# Patient Record
Sex: Male | Born: 1950 | Race: White | Hispanic: No | Marital: Married | Smoking: Former smoker
Health system: Southern US, Community
[De-identification: ages and names within clinical notes are randomized; demographics above are authoritative.]

## PROBLEM LIST (undated history)

## (undated) DIAGNOSIS — I10 Essential (primary) hypertension: Secondary | ICD-10-CM

## (undated) DIAGNOSIS — M549 Dorsalgia, unspecified: Secondary | ICD-10-CM

## (undated) DIAGNOSIS — G8929 Other chronic pain: Secondary | ICD-10-CM

## (undated) DIAGNOSIS — I959 Hypotension, unspecified: Secondary | ICD-10-CM

## (undated) DIAGNOSIS — E785 Hyperlipidemia, unspecified: Secondary | ICD-10-CM

## (undated) DIAGNOSIS — IMO0002 Reserved for concepts with insufficient information to code with codable children: Secondary | ICD-10-CM

## (undated) HISTORY — PX: ORIF FOOT FRACTURE: SHX2123

## (undated) HISTORY — DX: Hyperlipidemia, unspecified: E78.5

## (undated) HISTORY — DX: Hypotension, unspecified: I95.9

## (undated) HISTORY — DX: Essential (primary) hypertension: I10

---

## 2003-10-25 ENCOUNTER — Inpatient Hospital Stay (HOSPITAL_COMMUNITY): Admission: EM | Admit: 2003-10-25 | Discharge: 2003-10-26 | Payer: Self-pay

## 2003-10-27 ENCOUNTER — Emergency Department (HOSPITAL_COMMUNITY): Admission: EM | Admit: 2003-10-27 | Discharge: 2003-10-27 | Payer: Self-pay | Admitting: Emergency Medicine

## 2005-07-09 ENCOUNTER — Emergency Department (HOSPITAL_COMMUNITY): Admission: EM | Admit: 2005-07-09 | Discharge: 2005-07-09 | Payer: Self-pay | Admitting: Emergency Medicine

## 2009-06-05 ENCOUNTER — Emergency Department (HOSPITAL_COMMUNITY): Admission: EM | Admit: 2009-06-05 | Discharge: 2009-06-05 | Payer: Self-pay | Admitting: Emergency Medicine

## 2009-06-19 ENCOUNTER — Ambulatory Visit (HOSPITAL_BASED_OUTPATIENT_CLINIC_OR_DEPARTMENT_OTHER): Admission: RE | Admit: 2009-06-19 | Discharge: 2009-06-19 | Payer: Self-pay | Admitting: Orthopedic Surgery

## 2010-07-14 ENCOUNTER — Encounter: Admission: RE | Admit: 2010-07-14 | Discharge: 2010-07-14 | Payer: Self-pay | Admitting: Family Medicine

## 2010-08-24 IMAGING — CR DG CERVICAL SPINE COMPLETE 4+V
7 series · 7 of 7 positions shown · non-contrast
Comparison: None available.

CLINICAL DATA: Fall.

CERVICAL SPINE - COMPLETE 4+ VIEW

[t c-spine a.p.]
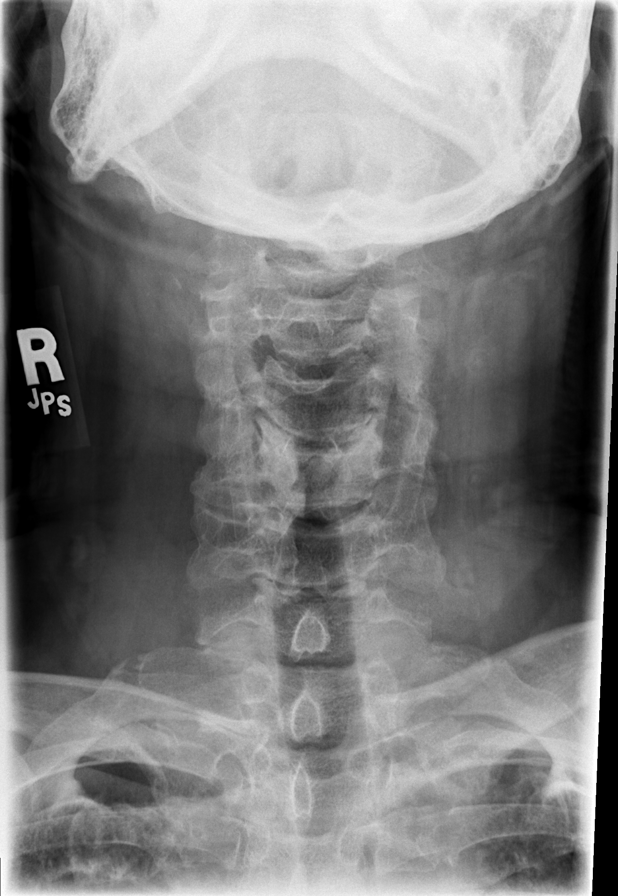

[t c-spine odontoid]
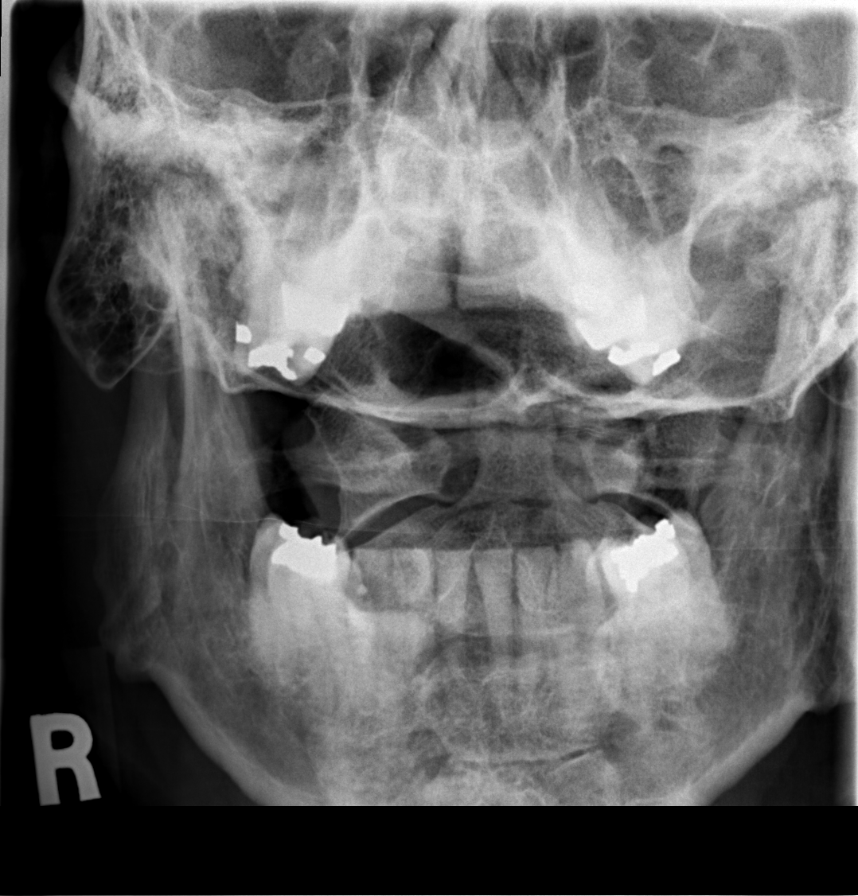

[t c-spine oblique (1 of 2)]
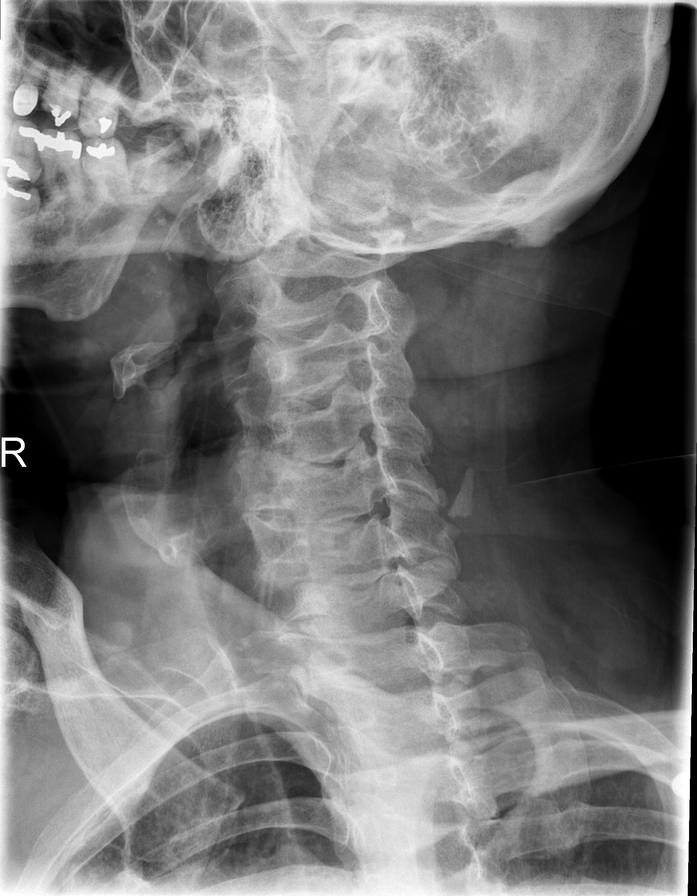

[t c-spine oblique (2 of 2)]
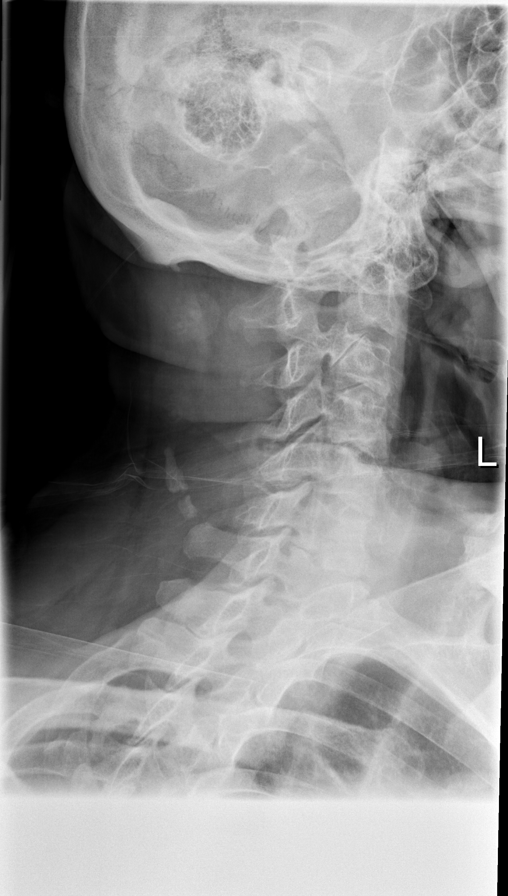

[t swimmers]
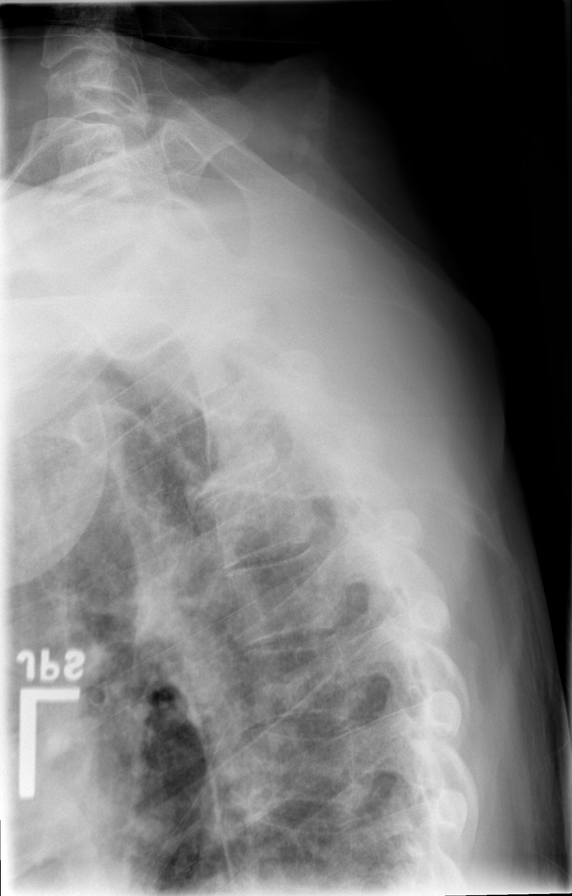

[t swimmers *]
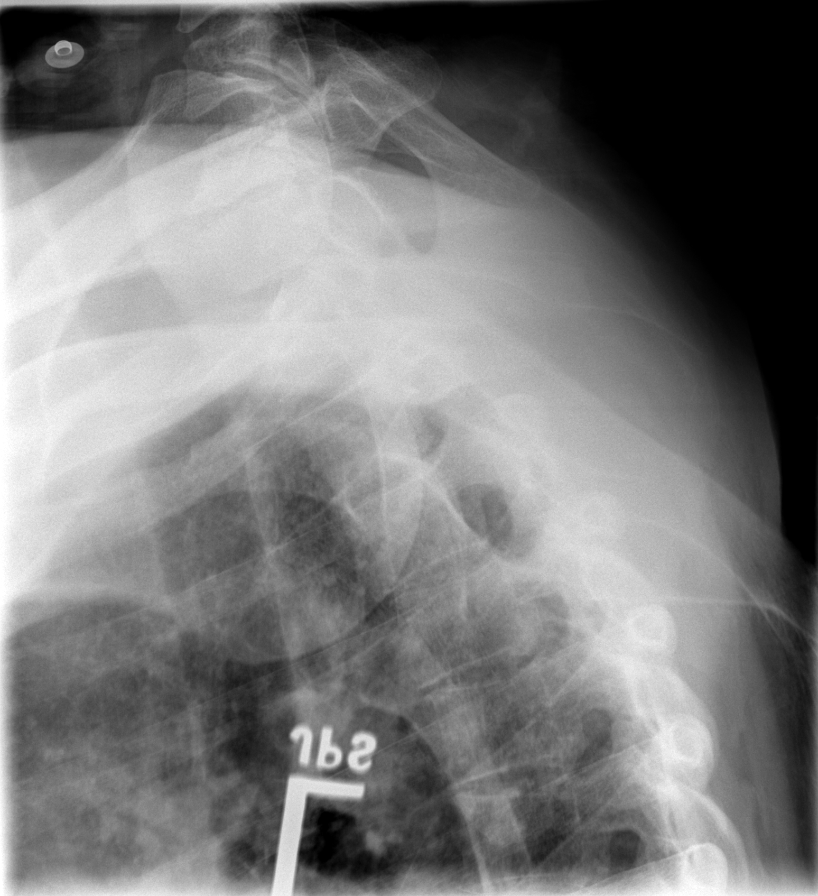

[w c-spine lat *]
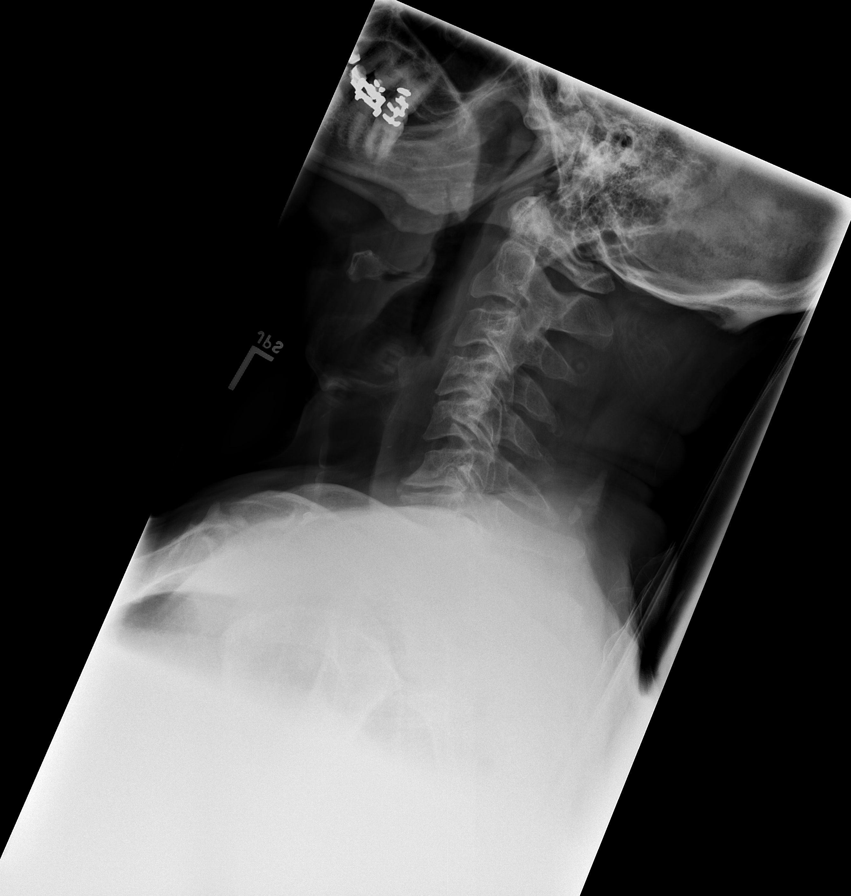

[7 of 7 positions shown; findings below may reference images not displayed]

FINDINGS: Vertebral body height and alignment are maintained.
There is degenerative disc disease most notable at C4-5 and C6-7.
Prevertebral soft tissues appear normal.
IMPRESSION: 1.  No acute finding.
2.  Degenerative disc disease.

## 2011-02-01 NOTE — Discharge Summary (Signed)
Antonio Elliott, Antonio Elliott                          ACCOUNT NO.:  [id number]   MEDICAL RECORD NO.:  [id number]                   PATIENT TYPE:  INP   LOCATION:  2021                                 FACILITY:  [facility]   PHYSICIAN:  Ellie Lunch, M.D.                   DATE OF BIRTH:  11-08-1950   DATE OF ADMISSION:  10/25/2003  DATE OF DISCHARGE:  10/26/2003                                 DISCHARGE SUMMARY   DISCHARGE DIAGNOSIS:  Chest pain, most likely vasovagal.   DISCHARGE MEDICATIONS:  1. Aspirin 81 mg every day.  2. Sublingual nitroglycerin 0.4 mg every 3 to 5 minutes p.r.n.   DISPOSITION:  The patient is to follow up with Dr. Windle Guard as per his  convenience. He is scheduled for a stress test   Dictation ends here                                                Ellie Lunch, M.D.    PB/MEDQ  D:  10/28/2003  T:  10/28/2003  Job:  [id number]   cc:   Windle Guard, M.D.  [address]  [place], [place] [address]  Fax: [phone]

## 2011-02-01 NOTE — Discharge Summary (Signed)
Antonio Elliott, Antonio Elliott                          ACCOUNT NO.:  [id number]   MEDICAL RECORD NO.:  [id number]                   PATIENT TYPE:  INP   LOCATION:  2021                                 FACILITY:  [facility]   PHYSICIAN:  Madaline Guthrie, M.D.                 DATE OF BIRTH:  January 06, 1951   DATE OF ADMISSION:  10/25/2003  DATE OF DISCHARGE:  10/26/2003                                 DISCHARGE SUMMARY   DISCHARGE DIAGNOSES:  Chest pain, most likely vasovagal, although ischemia  cannot be ruled out until stress test.   DISCHARGE MEDICATIONS:  1. Aspirin 81 mg daily.  2. Sublingual nitroglycerin 0.4 mg q3-5 minutes p.r.n.   DISPOSITION AND FOLLOW UP:  The patient is to follow up with Dr. Jeannetta Nap as  per convenience.  He is scheduled for a stress test with [facility] on October 28, 2003 at 10:15 a.m.   ADMISSION HISTORY AND PHYSICAL:  A 60 year old white male with no  significant past medical history who had an episode of heaviness in his  chest accompanied by flushing and dizziness while driving to work.  The  episode lasted for less than 1 minute.  The patient got his blood pressure  checked as soon as he got to work, which was within 78 minutes of the  episode, and it was 180/113.  His usual blood pressure runs around  130's/80's.  There was no chest pain, nausea, vomiting, sweating, or  radiation.  No similar episodes in the past.  The patient gives a history of  taking amoxicillin 500 mg t.i.d. for a sinus infection for the past 5 days.   PHYSICAL EXAMINATION:  GENERAL APPEARANCE:  No abnormality detected.  CARDIOVASCULAR:  S1 and S2 normal.  Regular rate and rhythm.  RESPIRATORY:  Bilateral breath sounds heard.  GI:  Abdomen soft, nontender, nondistended.  Bowel sounds present.  EXTREMITIES:  No clubbing, cyanosis, or edema.   ADMISSION LABORATORIES:  Sodium 140, potassium 4.1, chloride 109, bicarb 25,  BUN 13, creatinine 1, glucose 106, hemoglobin 16.  PT 11.9, INR  0.8, D-dimer  0.32.  Cardiac enzymes negative.  CT of the head without contrast.  No  bleed.  Chest x-ray normal.   HOSPITAL COURSE:  Chest pain.  The patient had negative cardiac enzymes.  No  acute changes on EKG, and the chest x-ray was normal.  Because of the past  history of smoking, and, in regards to the patient's age, he is advised to  get a stress test, which has been scheduled for October 27, 2003 at 10:15  a.m.  Also, a fasting lipid panel was obtained, and the results are as  follows - cholesterol 235, triglycerides 82, HDL cholesterol 64, LDL  cholesterol 155.   DISCHARGE LABORATORIES:  Hemoglobin 15.3, sodium 142, potassium 3.9, glucose  98.  Cardiac enzymes x8 negative.  AST 32, ALT 33, alkaline phosphatase 47,  total bilirubin 1, calcium 9.2.  Lipid profile as mentioned above.      Madaline Guthrie, M.D.                       Madaline Guthrie, M.D.    [place]/MEDQ  D:  10/28/2003  T:  10/29/2003  Job:  [id number]   cc:   Windle Guard, M.D.  [address]  [place], [place] [address]  Fax: [phone]

## 2011-08-04 ENCOUNTER — Encounter: Payer: Self-pay | Admitting: *Deleted

## 2011-08-04 ENCOUNTER — Emergency Department (HOSPITAL_COMMUNITY)
Admission: EM | Admit: 2011-08-04 | Discharge: 2011-08-04 | Disposition: A | Discharge status: Home / Self Care | Payer: BC Managed Care – PPO | Source: Home / Self Care | Attending: Family Medicine | Admitting: Family Medicine

## 2011-08-04 DIAGNOSIS — K047 Periapical abscess without sinus: Secondary | ICD-10-CM

## 2011-08-04 MED ORDER — ACETAMINOPHEN-CODEINE #3 300-30 MG PO TABS: 1.0000 | ORAL_TABLET | Freq: Four times a day (QID) | ORAL | Status: AC | PRN

## 2011-08-04 MED ORDER — AMOXICILLIN 500 MG PO CAPS: 500.0000 mg | ORAL_CAPSULE | Freq: Three times a day (TID) | ORAL | Status: AC

## 2011-08-04 NOTE — ED Provider Notes (Signed)
History     CSN: [id number] Arrival date & time: 08/04/2011 12:17 PM   First MD Initiated Contact with Patient 08/04/11 1059      Chief Complaint  Patient presents with  . Dental Pain    dental pain x 24 hours     (Consider location/radiation/quality/duration/timing/severity/associated sxs/prior treatment) Patient is a 60 y.o. male presenting with tooth pain.  Dental PainThe primary symptoms include mouth pain. The symptoms began 3 to 5 days ago. The symptoms are worsening.  Additional symptoms include: gum swelling and gum tenderness.    History reviewed. No pertinent past medical history.  Past Surgical History  Procedure Date  . Orif foot fracture     History reviewed. No pertinent family history.  History  Substance Use Topics  . Smoking status: Never Smoker   . Smokeless tobacco: Not on file  . Alcohol Use: Yes      Review of Systems  Constitutional: Negative.   HENT: Negative.   Respiratory: Negative.   Cardiovascular: Negative.   Gastrointestinal: Negative.   Genitourinary: Negative.     Allergies  Review of patient's allergies indicates no known allergies.  Home Medications   Current Outpatient Rx  Name Route Sig Dispense Refill  . ACETAMINOPHEN-CODEINE #3 300-30 MG PO TABS Oral Take 1-2 tablets by mouth every 6 (six) hours as needed for pain. 20 tablet 0  . AMOXICILLIN 500 MG PO CAPS Oral Take 1 capsule (500 mg total) by mouth 3 (three) times daily. 30 capsule 0    BP 154/96  Pulse 75  Temp(Src) 98.1 F (36.7 C) (Oral)  Resp 16  SpO2 98%  Physical Exam  Nursing note and vitals reviewed. Constitutional: He appears well-developed and well-nourished. No distress.  HENT:       Teeth in moderate repair. Right lower molar with caries and dental fillings is tooth causing pain. Slight swelling and erythema of gums.   Neck: Normal range of motion. Neck supple.  Cardiovascular: Normal rate.   Pulmonary/Chest: Effort normal.  Lymphadenopathy:    He has no cervical adenopathy.  Skin: Skin is warm and dry.    ED Course  Procedures (including critical care time)  Labs Reviewed - No data to display No results found.   1. Dental abscess       MDM          Randa Spike, MD 08/04/11 [phone]

## 2012-02-04 ENCOUNTER — Encounter (INDEPENDENT_AMBULATORY_CARE_PROVIDER_SITE_OTHER): Payer: BC Managed Care – PPO | Admitting: Ophthalmology

## 2012-02-04 DIAGNOSIS — H2510 Age-related nuclear cataract, unspecified eye: Secondary | ICD-10-CM

## 2012-02-04 DIAGNOSIS — H33309 Unspecified retinal break, unspecified eye: Secondary | ICD-10-CM

## 2012-02-04 DIAGNOSIS — H43819 Vitreous degeneration, unspecified eye: Secondary | ICD-10-CM

## 2012-02-24 ENCOUNTER — Ambulatory Visit (INDEPENDENT_AMBULATORY_CARE_PROVIDER_SITE_OTHER): Payer: BC Managed Care – PPO | Admitting: Ophthalmology

## 2012-02-24 DIAGNOSIS — H33309 Unspecified retinal break, unspecified eye: Secondary | ICD-10-CM

## 2012-03-06 ENCOUNTER — Ambulatory Visit (INDEPENDENT_AMBULATORY_CARE_PROVIDER_SITE_OTHER): Payer: BC Managed Care – PPO | Admitting: Ophthalmology

## 2012-03-06 DIAGNOSIS — H33309 Unspecified retinal break, unspecified eye: Secondary | ICD-10-CM

## 2012-05-04 ENCOUNTER — Encounter (INDEPENDENT_AMBULATORY_CARE_PROVIDER_SITE_OTHER): Payer: BC Managed Care – PPO | Admitting: Ophthalmology

## 2012-05-04 DIAGNOSIS — H33309 Unspecified retinal break, unspecified eye: Secondary | ICD-10-CM

## 2012-05-04 DIAGNOSIS — H43819 Vitreous degeneration, unspecified eye: Secondary | ICD-10-CM

## 2012-07-06 ENCOUNTER — Ambulatory Visit (INDEPENDENT_AMBULATORY_CARE_PROVIDER_SITE_OTHER): Payer: BC Managed Care – PPO | Admitting: Ophthalmology

## 2012-07-06 DIAGNOSIS — H2510 Age-related nuclear cataract, unspecified eye: Secondary | ICD-10-CM

## 2012-07-06 DIAGNOSIS — H33309 Unspecified retinal break, unspecified eye: Secondary | ICD-10-CM

## 2012-07-06 DIAGNOSIS — H43819 Vitreous degeneration, unspecified eye: Secondary | ICD-10-CM

## 2013-05-20 ENCOUNTER — Other Ambulatory Visit: Payer: Self-pay | Admitting: Family Medicine

## 2013-05-20 DIAGNOSIS — M545 Low back pain: Secondary | ICD-10-CM

## 2013-05-25 ENCOUNTER — Ambulatory Visit
Admission: RE | Admit: 2013-05-25 | Discharge: 2013-05-25 | Disposition: A | Discharge status: Home / Self Care | Payer: BC Managed Care – PPO | Source: Ambulatory Visit | Attending: Family Medicine | Admitting: Family Medicine

## 2013-05-25 DIAGNOSIS — M545 Low back pain, unspecified: Secondary | ICD-10-CM

## 2013-07-06 ENCOUNTER — Ambulatory Visit (INDEPENDENT_AMBULATORY_CARE_PROVIDER_SITE_OTHER): Payer: BC Managed Care – PPO | Admitting: Ophthalmology

## 2013-11-20 ENCOUNTER — Emergency Department (HOSPITAL_COMMUNITY): Payer: BC Managed Care – PPO

## 2013-11-20 ENCOUNTER — Encounter (HOSPITAL_COMMUNITY): Payer: Self-pay | Admitting: Emergency Medicine

## 2013-11-20 ENCOUNTER — Emergency Department (HOSPITAL_COMMUNITY)
Admission: EM | Admit: 2013-11-20 | Discharge: 2013-11-20 | Disposition: A | Discharge status: Home / Self Care | Payer: BC Managed Care – PPO | Attending: Emergency Medicine | Admitting: Emergency Medicine

## 2013-11-20 DIAGNOSIS — N451 Epididymitis: Secondary | ICD-10-CM

## 2013-11-20 DIAGNOSIS — Z8739 Personal history of other diseases of the musculoskeletal system and connective tissue: Secondary | ICD-10-CM | POA: Insufficient documentation

## 2013-11-20 DIAGNOSIS — G8929 Other chronic pain: Secondary | ICD-10-CM | POA: Insufficient documentation

## 2013-11-20 DIAGNOSIS — F17200 Nicotine dependence, unspecified, uncomplicated: Secondary | ICD-10-CM | POA: Insufficient documentation

## 2013-11-20 DIAGNOSIS — N453 Epididymo-orchitis: Secondary | ICD-10-CM | POA: Insufficient documentation

## 2013-11-20 HISTORY — DX: Dorsalgia, unspecified: M54.9

## 2013-11-20 HISTORY — DX: Other chronic pain: G89.29

## 2013-11-20 HISTORY — DX: Reserved for concepts with insufficient information to code with codable children: IMO0002

## 2013-11-20 LAB — BASIC METABOLIC PANEL
BUN: 10 mg/dL (ref 6–23)
CHLORIDE: 106 meq/L (ref 96–112)
CO2: 23 meq/L (ref 19–32)
Calcium: 9.2 mg/dL (ref 8.4–10.5)
Creatinine, Ser: 0.94 mg/dL (ref 0.50–1.35)
GFR calc non Af Amer: 88 mL/min — ABNORMAL LOW (ref >90)
GLUCOSE: 115 mg/dL — AB (ref 70–99)
POTASSIUM: 4.1 meq/L (ref 3.7–5.3)
Sodium: 141 mEq/L (ref 137–147)

## 2013-11-20 LAB — CBC WITH DIFFERENTIAL/PLATELET
BASOS PCT: 0 % (ref 0–1)
Basophils Absolute: 0 10*3/uL (ref 0.0–0.1)
Eosinophils Absolute: 0.1 10*3/uL (ref 0.0–0.7)
Eosinophils Relative: 1 % (ref 0–5)
HEMATOCRIT: 47.2 % (ref 39.0–52.0)
HEMOGLOBIN: 16.6 g/dL (ref 13.0–17.0)
LYMPHS PCT: 16 % (ref 12–46)
Lymphs Abs: 2 10*3/uL (ref 0.7–4.0)
MCH: 34.4 pg — ABNORMAL HIGH (ref 26.0–34.0)
MCHC: 35.2 g/dL (ref 30.0–36.0)
MCV: 97.7 fL (ref 78.0–100.0)
MONO ABS: 0.9 10*3/uL (ref 0.1–1.0)
MONOS PCT: 7 % (ref 3–12)
NEUTROS ABS: 9 10*3/uL — AB (ref 1.7–7.7)
Neutrophils Relative %: 75 % (ref 43–77)
Platelets: 224 10*3/uL (ref 150–400)
RBC: 4.83 MIL/uL (ref 4.22–5.81)
RDW: 13 % (ref 11.5–15.5)
WBC: 12 10*3/uL — ABNORMAL HIGH (ref 4.0–10.5)

## 2013-11-20 LAB — URINALYSIS, ROUTINE W REFLEX MICROSCOPIC
BILIRUBIN URINE: NEGATIVE
Glucose, UA: NEGATIVE mg/dL
HGB URINE DIPSTICK: NEGATIVE
Ketones, ur: NEGATIVE mg/dL
Leukocytes, UA: NEGATIVE
Nitrite: NEGATIVE
PH: 6 (ref 5.0–8.0)
Protein, ur: NEGATIVE mg/dL
SPECIFIC GRAVITY, URINE: 1.018 (ref 1.005–1.030)
Urobilinogen, UA: 0.2 mg/dL (ref 0.0–1.0)

## 2013-11-20 MED ORDER — LEVOFLOXACIN 500 MG PO TABS: 500.0000 mg | ORAL_TABLET | Freq: Every day | ORAL | Status: DC

## 2013-11-20 MED ORDER — IBUPROFEN 800 MG PO TABS: 800.0000 mg | ORAL_TABLET | Freq: Three times a day (TID) | ORAL | Status: DC

## 2013-11-20 MED ORDER — HYDROCODONE-ACETAMINOPHEN 5-325 MG PO TABS: 1.0000 | ORAL_TABLET | ORAL | Status: DC | PRN

## 2013-11-20 NOTE — ED Notes (Signed)
Blood and urine obtained and sent, [facility], orders received and initiated. Pt denies need for pain med at this time. Alert, NAD, calm, interactive, resps e/u, speaking in clear complete sentences, VSS. Steady gait. Pending US.

## 2013-11-20 NOTE — Discharge Instructions (Signed)
Epididymitis  Epididymitis is a swelling (inflammation) of the epididymis. The epididymis is a cord-like structure along the back part of the testicle. Epididymitis is usually, but not always, caused by infection. This is usually a sudden problem beginning with chills, fever and pain behind the scrotum and in the testicle. There may be swelling and redness of the testicle.  DIAGNOSIS   Physical examination will reveal a tender, swollen epididymis. Sometimes, cultures are obtained from the urine or from prostate secretions to help find out if there is an infection or if the cause is a different problem. Sometimes, blood work is performed to see if your white blood cell count is elevated and if a germ (bacterial) or viral infection is present. Using this knowledge, an appropriate medicine which kills germs (antibiotic) can be chosen by your caregiver. A viral infection causing epididymitis will most often go away (resolve) without treatment.  HOME CARE INSTRUCTIONS   · Hot sitz baths for 20 minutes, 4 times per day, may help relieve pain.  · Only take over-the-counter or prescription medicines for pain, discomfort or fever as directed by your caregiver.  · Take all medicines, including antibiotics, as directed. Take the antibiotics for the full prescribed length of time even if you are feeling better.  · It is very important to keep all follow-up appointments.  SEEK IMMEDIATE MEDICAL CARE IF:   · You have a fever.  · You have pain not relieved with medicines.  · You have any worsening of your problems.  · Your pain seems to come and go.  · You develop pain, redness, and swelling in the scrotum and surrounding areas.  MAKE SURE YOU:   · Understand these instructions.  · Will watch your condition.  · Will get help right away if you are not doing well or get worse.  Document Released: 08/30/2000 Document Revised: 11/25/2011 Document Reviewed: 07/20/2009  Exit[employer]® Patient Information ©2014 [employer].

## 2013-11-20 NOTE — ED Notes (Signed)
C/o LLQ & L testicle pain, also L testicle swelling. Swelling onset 1 week ago, pain onset yesterday. Also reports chronic degenerative disc back pain, (denies: nvd, dizziness, other urinary sx), (denies: hematuria, d/c, urinary hx, urinary surgery, change in flow), no GU MD, last ate 1000 yesterday, no meds PTA.

## 2013-11-20 NOTE — ED Provider Notes (Signed)
CSN: [id number]     Arrival date & time 11/20/13  0540 History   None    Chief Complaint  Patient presents with  . Abdominal Pain  . Groin Swelling     (Consider location/radiation/quality/duration/timing/severity/associated sxs/prior Treatment) HPI 63 year old male presents to emergency room with complaint of left lower quadrant pain and left scrotal tenderness and swelling.  Patient reports onset of pain yesterday afternoon.  Pain came on gradual.  He reports that he has noticed some swelling in his left testicle./Scrotum.  Over the last week.  He denies any fever, chills, no nausea and vomiting.  He had normal bowel yesterday.  He has had colonoscopy in the last 10 years.  That was normal.  No prior history of similar symptoms.  He denies any swelling or bulge in his groin.  No urinary symptoms Past Medical History  Diagnosis Date  . Degenerative disk disease   . Back pain, chronic    Past Surgical History  Procedure Laterality Date  . Orif foot fracture     No family history on file. History  Substance Use Topics  . Smoking status: Current Some Day Smoker  . Smokeless tobacco: Not on file  . Alcohol Use: Yes    Review of Systems  See History of Present Illness; otherwise all other systems are reviewed and negative   Allergies  Review of patient's allergies indicates no known allergies.  Home Medications  No current outpatient prescriptions on file. BP 150/89  Pulse 91  Temp(Src) 98.2 F (36.8 C) (Oral)  Resp 18  SpO2 99% Physical Exam  Nursing note and vitals reviewed. Constitutional: He is oriented to person, place, and time. He appears well-developed and well-nourished.  HENT:  Head: Normocephalic and atraumatic.  Nose: Nose normal.  Mouth/Throat: Oropharynx is clear and moist.  Eyes: Conjunctivae and EOM are normal. Pupils are equal, round, and reactive to light.  Neck: Normal range of motion. Neck supple. No JVD present. No tracheal deviation present. No  thyromegaly present.  Cardiovascular: Normal rate, regular rhythm, normal heart sounds and intact distal pulses.  Exam reveals no gallop and no friction rub.   No murmur heard. Pulmonary/Chest: Effort normal and breath sounds normal. No stridor. No respiratory distress. He has no wheezes. He has no rales. He exhibits no tenderness.  Abdominal: Soft. Bowel sounds are normal. He exhibits no distension and no mass. There is no tenderness. There is no rebound and no guarding.  Genitourinary:  A shunt, with tenderness with palpation of left testicle.  No significant edema or swelling noted.  He has normal lie.  Normal cremasteric response.  Tenderness is mainly posterior and lateral of the testicle.  There is no hernias noted on exam.  No scrotal masses.  No inguinal masses.  Patient without abdominal pain on palpation.  Musculoskeletal: Normal range of motion. He exhibits no edema and no tenderness.  Lymphadenopathy:    He has no cervical adenopathy.  Neurological: He is alert and oriented to person, place, and time. He exhibits normal muscle tone. Coordination normal.  Skin: Skin is warm and dry. No rash noted. No erythema. No pallor.  Psychiatric: He has a normal mood and affect. His behavior is normal. Judgment and thought content normal.    ED Course  Procedures (including critical care time) Labs Review Labs Reviewed - No data to display Imaging Review No results found.   EKG Interpretation None      MDM   Final diagnoses:  None  63 year old male with left lower cord or pain, left testicular pain.  Differential includes kidney stone, testicular torsion, epididymitis, diverticulitis.  We'll plan for labs, urine, Doppler, and ultrasound of testicle, and scrotum.  If negative, may need CT scan of abdomen, pelvis.    Olivia Mackielga M Lynell Greenhouse, MD 11/20/13 [phone]

## 2013-11-20 NOTE — ED Notes (Signed)
Alert, NAD, calm, interactive, to US. 

## 2019-12-23 ENCOUNTER — Ambulatory Visit: Payer: Medicare Other | Attending: Internal Medicine

## 2019-12-23 DIAGNOSIS — Z23 Encounter for immunization: Secondary | ICD-10-CM

## 2019-12-23 NOTE — Progress Notes (Signed)
   Covid-19 Vaccination Clinic  Name:  Antonio Elliott    MRN: [id number] DOB: 1950-10-28  12/23/2019  Antonio Elliott was observed post Covid-19 immunization for 15 minutes without incident. He was provided with Vaccine Information Sheet and instruction to access the V-Safe system.   Antonio Elliott was instructed to call 911 with any severe reactions post vaccine: Marland Kitchen Difficulty breathing  . Swelling of face and throat  . A fast heartbeat  . A bad rash all over body  . Dizziness and weakness   Immunizations Administered    Name Date Dose VIS Date Route   Pfizer COVID-19 Vaccine 12/23/2019  1:01 PM 0.3 mL 08/27/2019 Intramuscular   Manufacturer: [employer], [employer]   Lot: [id number]   NDC: [address]-5

## 2020-01-17 ENCOUNTER — Ambulatory Visit: Payer: Medicare Other | Attending: Internal Medicine

## 2020-01-17 DIAGNOSIS — Z23 Encounter for immunization: Secondary | ICD-10-CM

## 2020-01-17 NOTE — Progress Notes (Signed)
   Covid-19 Vaccination Clinic  Name:  Antonio Elliott    MRN: [id number] DOB: 10/23/1950  01/17/2020  Antonio Elliott was observed post Covid-19 immunization for 15 minutes without incident. He was provided with Vaccine Information Sheet and instruction to access the V-Safe system.   Antonio Elliott was instructed to call 911 with any severe reactions post vaccine: Marland Kitchen Difficulty breathing  . Swelling of face and throat  . A fast heartbeat  . A bad rash all over body  . Dizziness and weakness   Immunizations Administered    Name Date Dose VIS Date Route   Pfizer COVID-19 Vaccine 01/17/2020 12:44 PM 0.3 mL 11/10/2018 Intramuscular   Manufacturer: [employer], [employer]   Lot: [id number]   NDC: [address]-5

## 2020-11-13 ENCOUNTER — Ambulatory Visit: Payer: Self-pay | Admitting: Cardiology

## 2020-11-13 ENCOUNTER — Other Ambulatory Visit: Payer: Self-pay

## 2020-11-13 ENCOUNTER — Encounter: Payer: Self-pay | Admitting: Cardiology

## 2020-11-13 VITALS — BP 155/101 | HR 77 | Temp 97.7°F | Resp 16 | Ht >= 80 in | Wt 209.2 lb

## 2020-11-13 DIAGNOSIS — I491 Atrial premature depolarization: Secondary | ICD-10-CM

## 2020-11-13 DIAGNOSIS — R030 Elevated blood-pressure reading, without diagnosis of hypertension: Secondary | ICD-10-CM

## 2020-11-13 DIAGNOSIS — R55 Syncope and collapse: Secondary | ICD-10-CM

## 2020-11-13 NOTE — Progress Notes (Signed)
Primary Physician/Referring:  Leonard Downing, MD  Patient ID: Antonio Elliott, male    DOB: 1951-02-22, 70 y.o.   MRN: [id number]  Chief Complaint  Patient presents with  . Hypotension  . Dizziness  . New Patient (Initial Visit)   HPI:    Antonio Elliott  is a 70 y.o. Caucasian male with no significant prior cardiovascular history except for degenerative disc disease, over the past 2 weeks has noticed sudden onset of dizzy spells and near syncope.  First episode occurred while he was working in his yard, on 10/26/2020, suddenly felt he was going to pass out.  Episode lasted for about a minute or so then he felt extremely fatigued and weak.  A week later that his on 11/03/2020, while he was driving to [place], suddenly felt he is going to pass out.  He had to stop his vehicle and his wife took over driving.  Again he felt extremely fatigued and weak after the episode, episode lasted a few minutes.  Otherwise he is feeling well, he goes for a walk at least 1.5 miles on a daily basis with his wife which he did this morning as well without any dyspnea or chest pain.  He has now been told to have hypertension or hyperlipidemia.  He noticed his blood pressure was low 1 day following his near syncopal episode a total 90/70 mmHg but overall states that blood pressure is usually around 120/70 mmHg.  Past Medical History:  Diagnosis Date  . Back pain, chronic   . Degenerative disk disease   . Hypotension    Past Surgical History:  Procedure Laterality Date  . ORIF FOOT FRACTURE     Family History  Problem Relation Age of Onset  . Alzheimer's disease Mother     Social History   Tobacco Use  . Smoking status: Former Smoker    Types: Cigarettes    Quit date: 2020    Years since quitting: 2.1  . Smokeless tobacco: Never Used  Substance Use Topics  . Alcohol use: Yes    Alcohol/week: 1.0 standard drink    Types: 1 Glasses of wine per week    Comment: occasionally    Marital  Status: Married  ROS  Review of Systems  Cardiovascular: Positive for palpitations and syncope (near syncope). Negative for chest pain, dyspnea on exertion and leg swelling.  Musculoskeletal: Positive for back pain.  Gastrointestinal: Negative for melena.  Neurological: Positive for dizziness.   Objective  Blood pressure (!) 155/101, pulse 77, temperature 97.7 F (36.5 C), temperature source Temporal, resp. rate 16, height 8' (2.438 m), weight 209 lb 3.2 oz (94.9 kg), SpO2 96 %.  Vitals with BMI 11/13/2020 11/20/2013 11/20/2013  Height $Remov'8\' 0"'XUqzup$  - -  Weight 209 lbs 3 oz - -  BMI 76.73 - -  Systolic 419 379 024  Diastolic 097 88 83  Pulse 77 73 74   Orthostatic VS for the past 72 hrs (Last 3 readings):  Orthostatic BP Patient Position BP Location Cuff Size Orthostatic Pulse  11/13/20 1409 (!) 154/101 Standing Left Arm Large 83  11/13/20 1408 (!) 149/105 Sitting Left Arm Large 88  11/13/20 1407 (!) 157/105 Supine Left Arm Large 78      Physical Exam Cardiovascular:     Rate and Rhythm: Normal rate and regular rhythm.     Pulses: Intact distal pulses.     Heart sounds: Normal heart sounds. No murmur heard. No gallop.  Comments: No leg edema, no JVD. Pulmonary:     Effort: Pulmonary effort is normal.     Breath sounds: Normal breath sounds.  Abdominal:     General: Bowel sounds are normal.     Palpations: Abdomen is soft.    Laboratory examination:   No results for input(s): NA, K, CL, CO2, GLUCOSE, BUN, CREATININE, CALCIUM, GFRNONAA, GFRAA in the last 8760 hours. CrCl cannot be calculated (Patient's most recent lab result is older than the maximum 21 days allowed.).  CMP Latest Ref Rng & Units 11/20/2013  Glucose 70 - 99 mg/dL 115(H)  BUN 6 - 23 mg/dL 10  Creatinine 0.50 - 1.35 mg/dL 0.94  Sodium 137 - 147 mEq/L 141  Potassium 3.7 - 5.3 mEq/L 4.1  Chloride 96 - 112 mEq/L 106  CO2 19 - 32 mEq/L 23  Calcium 8.4 - 10.5 mg/dL 9.2   CBC Latest Ref Rng & Units 11/20/2013  WBC 4.0  - 10.5 K/uL 12.0(H)  Hemoglobin 13.0 - 17.0 g/dL 16.6  Hematocrit 39.0 - 52.0 % 47.2  Platelets 150 - 400 K/uL 224    Lipid Panel No results for input(s): CHOL, TRIG, LDLCALC, VLDL, HDL, CHOLHDL, LDLDIRECT in the last 8760 hours.  HEMOGLOBIN A1C No results found for: HGBA1C, MPG TSH No results for input(s): TSH in the last 8760 hours.  External labs:    Hemoglobin 17.500 G/ 05/15/2020 Platelets 176.000 X1 05/15/2020  Creatinine, Serum 0.910 MG/ 05/15/2020 ALT (SGPT) 22.000 IU/ 05/15/2020  Medications and allergies  No Known Allergies   Outpatient Medications Prior to Visit  Medication Sig Dispense Refill  . Ascorbic Acid (VITAMIN C) 1000 MG tablet Take 1,000 mg by mouth daily.    . ASCORBIC ACID PO Take 2,000 mg by mouth daily.    Marland Kitchen aspirin EC 325 MG tablet Take 650 mg by mouth daily as needed for moderate pain.    . Omega-3 Fatty Acids ([employer] FISH OIL) 1000 MG CAPS Take 1 capsule by mouth daily.    Marland Kitchen HYDROcodone-acetaminophen (NORCO/VICODIN) 5-325 MG per tablet Take 1-2 tablets by mouth every 4 (four) hours as needed. 12 tablet 0  . ibuprofen (ADVIL,MOTRIN) 200 MG tablet Take 600 mg by mouth every 6 (six) hours as needed (foot or back pain).    Marland Kitchen ibuprofen (ADVIL,MOTRIN) 800 MG tablet Take 1 tablet (800 mg total) by mouth 3 (three) times daily. 21 tablet 0  . levofloxacin (LEVAQUIN) 500 MG tablet Take 1 tablet (500 mg total) by mouth daily. 10 tablet 0  . ranitidine (ZANTAC) 150 MG tablet Take 150 mg by mouth daily as needed for heartburn.     No facility-administered medications prior to visit.    Radiology:   No results found.  Cardiac Studies:   None EKG:     EKG 11/13/2020: Normal sinus rhythm at rate of 72 bpm, normal axis, no evidence of ischemia, normal EKG. Frequent PACs.  Assessment     ICD-10-CM   1. Near syncope  R55 EKG 12-Lead    LONG TERM MONITOR (3-14 DAYS)    [facility] ECHOCARDIOGRAM COMPLETE    [facility] CARDIAC STRESS TEST    Lipid Panel With LDL/HDL Ratio     CMP14+EGFR    CBC  2. Elevated BP without diagnosis of hypertension  R03.0 TSH  3. PAC (premature atrial contraction)  I49.1 TSH    Medications Discontinued During This Encounter  Medication Reason  . ranitidine (ZANTAC) 150 MG tablet Error  . levofloxacin (LEVAQUIN) 500 MG tablet Error  .  ibuprofen (ADVIL,MOTRIN) 800 MG tablet Error  . ibuprofen (ADVIL,MOTRIN) 200 MG tablet Error  . HYDROcodone-acetaminophen (NORCO/VICODIN) 5-325 MG per tablet Error    No orders of the defined types were placed in this encounter.  Orders Placed This Encounter  Procedures  . Lipid Panel With LDL/HDL Ratio  . CMP14+EGFR  . CBC  . TSH  . LONG TERM MONITOR (3-14 DAYS)    Standing Status:   Future    Standing Expiration Date:   11/13/2021    Order Specific Question:   Where should this test be performed?    Answer:   [facility]    Order Specific Question:   Does the patient have an implanted cardiac device?    Answer:   No    Order Specific Question:   Prescribed days of wear    Answer:   68    Order Specific Question:   Release to patient    Answer:   Immediate  . [facility] CARDIAC STRESS TEST    Standing Status:   Future    Standing Expiration Date:   01/11/2021  . EKG 12-Lead  . [facility] ECHOCARDIOGRAM COMPLETE    Standing Status:   Future    Standing Expiration Date:   11/13/2021   Recommendations:   [place] Yepiz Etherington is a 70 y.o. [place] male with no significant prior cardiovascular history except for degenerative disc disease, over the past 2 weeks has noticed sudden onset of dizzy spells and near syncope.  First episode occurred while he was working in his yard, on 10/26/2020, suddenly felt he was going to pass out.  Episode lasted for about a minute or so then he felt extremely fatigued and weak.  A week later that his on 11/03/2020, while he was driving to [place], suddenly felt he is going to pass out.  He had to pull over and his wife drove the rest of the way.  Symptoms are very  classic for sudden onset near syncope, appears to be cardiogenic.  I will perform extended outpatient EKG monitoring for 2 weeks.  We will obtain an echocardiogram and routine treadmill exercise stress test.  His blood pressure is markedly elevated today, he was orthostatic negative.  He will continue to monitor his blood pressure at home and report if they persistently remain high.  I will have a low threshold to start him on therapy.  I will see him back in 3 to 4 weeks for follow-up.  We will obtain basic labs including CMP, CBC, TSH and lipid profile testing.    Adrian Prows, MD, [facility] 11/13/2020, 2:46 PM Office: [phone]

## 2020-11-14 ENCOUNTER — Other Ambulatory Visit: Payer: Self-pay

## 2020-11-14 ENCOUNTER — Other Ambulatory Visit: Payer: Medicare Other

## 2020-11-14 DIAGNOSIS — R55 Syncope and collapse: Secondary | ICD-10-CM

## 2020-11-15 LAB — CMP14+EGFR
ALT: 27 IU/L (ref 0–44)
AST: 18 IU/L (ref 0–40)
Albumin/Globulin Ratio: 2 (ref 1.2–2.2)
Albumin: 4.3 g/dL (ref 3.8–4.8)
Alkaline Phosphatase: 61 IU/L (ref 44–121)
BUN/Creatinine Ratio: 15 (ref 10–24)
BUN: 17 mg/dL (ref 8–27)
Bilirubin Total: 0.5 mg/dL (ref 0.0–1.2)
CO2: 21 mmol/L (ref 20–29)
Calcium: 9.8 mg/dL (ref 8.6–10.2)
Chloride: 105 mmol/L (ref 96–106)
Creatinine, Ser: 1.16 mg/dL (ref 0.76–1.27)
Globulin, Total: 2.2 g/dL (ref 1.5–4.5)
Glucose: 104 mg/dL — ABNORMAL HIGH (ref 65–99)
Potassium: 5.4 mmol/L — ABNORMAL HIGH (ref 3.5–5.2)
Sodium: 142 mmol/L (ref 134–144)
Total Protein: 6.5 g/dL (ref 6.0–8.5)
eGFR: 68 mL/min/{1.73_m2} (ref >59)

## 2020-11-15 LAB — CBC
Hematocrit: 48.7 % (ref 37.5–51.0)
Hemoglobin: 16.4 g/dL (ref 13.0–17.7)
MCH: 33.5 pg — ABNORMAL HIGH (ref 26.6–33.0)
MCHC: 33.7 g/dL (ref 31.5–35.7)
MCV: 99 fL — ABNORMAL HIGH (ref 79–97)
Platelets: 237 10*3/uL (ref 150–450)
RBC: 4.9 x10E6/uL (ref 4.14–5.80)
RDW: 12.4 % (ref 11.6–15.4)
WBC: 6.3 10*3/uL (ref 3.4–10.8)

## 2020-11-15 LAB — LIPID PANEL WITH LDL/HDL RATIO
Cholesterol, Total: 263 mg/dL — ABNORMAL HIGH (ref 100–199)
HDL: 50 mg/dL (ref >39)
LDL Chol Calc (NIH): 194 mg/dL — ABNORMAL HIGH (ref 0–99)
LDL/HDL Ratio: 3.9 ratio — ABNORMAL HIGH (ref 0.0–3.6)
Triglycerides: 107 mg/dL (ref 0–149)
VLDL Cholesterol Cal: 19 mg/dL (ref 5–40)

## 2020-11-15 LAB — TSH: TSH: 1.48 u[IU]/mL (ref 0.450–4.500)

## 2020-11-15 NOTE — Progress Notes (Signed)
Markedly elevated LDL cholesterol and will need therapy.  Serum glucose mildly elevated, potassium mildly elevated, otherwise CMP normal.  CBC reveals mild macrocytosis. TSH normal. Will discuss on office visit. CC: PCP

## 2020-11-21 ENCOUNTER — Telehealth: Payer: Self-pay

## 2020-11-21 ENCOUNTER — Ambulatory Visit: Payer: Medicare Other

## 2020-11-21 ENCOUNTER — Other Ambulatory Visit: Payer: Self-pay

## 2020-11-21 DIAGNOSIS — R55 Syncope and collapse: Secondary | ICD-10-CM

## 2020-11-21 NOTE — Telephone Encounter (Signed)
Patient came in for echo today and showed me where he is wearing the monitor and his skin is severly reacting to it, he will take it off today and mail it back, he wore it for 7 days.

## 2020-11-21 NOTE — Telephone Encounter (Signed)
Sure

## 2020-11-22 ENCOUNTER — Ambulatory Visit: Payer: Medicare Other

## 2020-11-22 DIAGNOSIS — R55 Syncope and collapse: Secondary | ICD-10-CM

## 2020-11-24 NOTE — Progress Notes (Signed)
No ischemia. Low work load and reduced exercise capacity

## 2020-11-27 NOTE — Progress Notes (Signed)
Mild AI. Otherwise normal. Discuss on OV

## 2020-12-14 ENCOUNTER — Other Ambulatory Visit: Payer: Self-pay

## 2020-12-14 ENCOUNTER — Ambulatory Visit: Payer: Medicare Other | Admitting: Cardiology

## 2020-12-14 ENCOUNTER — Encounter: Payer: Self-pay | Admitting: Cardiology

## 2020-12-14 VITALS — BP 157/94 | HR 94 | Temp 98.0°F | Resp 16 | Ht 70.0 in | Wt 210.4 lb

## 2020-12-14 DIAGNOSIS — R55 Syncope and collapse: Secondary | ICD-10-CM

## 2020-12-14 DIAGNOSIS — E7800 Pure hypercholesterolemia, unspecified: Secondary | ICD-10-CM

## 2020-12-14 DIAGNOSIS — R739 Hyperglycemia, unspecified: Secondary | ICD-10-CM

## 2020-12-14 DIAGNOSIS — I441 Atrioventricular block, second degree: Secondary | ICD-10-CM

## 2020-12-14 MED ORDER — AMLODIPINE BESYLATE 10 MG PO TABS: 10.0000 mg | ORAL_TABLET | Freq: Every evening | ORAL | 2 refills | Status: DC

## 2020-12-14 MED ORDER — ROSUVASTATIN CALCIUM 20 MG PO TABS: 20.0000 mg | ORAL_TABLET | Freq: Every day | ORAL | 2 refills | Status: DC

## 2020-12-14 NOTE — Patient Instructions (Signed)
CrCl cannot be calculated (Patient's most recent lab result is older than the maximum 21 days allowed.).  CMP Latest Ref Rng & Units 11/14/2020 11/20/2013  Glucose 65 - 99 mg/dL 798(X) 211(H)  BUN 8 - 27 mg/dL 17 10  Creatinine 4.17 - 1.27 mg/dL 4.08 1.44  Sodium 818 - 144 mmol/L 142 141  Potassium 3.5 - 5.2 mmol/L 5.4(H) 4.1  Chloride 96 - 106 mmol/L 105 106  CO2 20 - 29 mmol/L 21 23  Calcium 8.6 - 10.2 mg/dL 9.8 9.2  Total Protein 6.0 - 8.5 g/dL 6.5 -  Total Bilirubin 0.0 - 1.2 mg/dL 0.5 -  Alkaline Phos 44 - 121 IU/L 61 -  AST 0 - 40 IU/L 18 -  ALT 0 - 44 IU/L 27 -   CBC Latest Ref Rng & Units 11/14/2020 11/20/2013  WBC 3.4 - 10.8 x10E3/uL 6.3 12.0(H)  Hemoglobin 13.0 - 17.7 g/dL 56.3 14.9  Hematocrit 70.2 - 51.0 % 48.7 47.2  Platelets 150 - 450 x10E3/uL 237 224    Lipid Panel Recent Labs    11/14/20 0835  CHOL 263*  TRIG 107  LDLCALC 194*  HDL 50   Recent Labs    11/14/20 0835  TSH 1.480

## 2020-12-14 NOTE — Progress Notes (Signed)
Primary Physician/Referring:  Kaleen Mask, MD  Patient ID: Antonio Elliott, male    DOB: 02-Jun-1951, 70 y.o.   MRN: [id number]  Chief Complaint  Patient presents with  . Hypertension  . Loss of Consciousness  . Results  . Follow-up    4 weeks   HPI:    Antonio Elliott  is a 70 y.o. Caucasian male with no significant prior cardiovascular history except for degenerative disc disease, over the past 2 weeks has noticed sudden onset of dizzy spells and near syncope.  First episode occurred while he was working in his yard, on 10/26/2020, suddenly felt he was going to pass out.  Episode lasted for about a minute or so then he felt extremely fatigued and weak.  A week later that his on 11/03/2020, while he was driving to [place], suddenly felt he is going to pass out.  He had to stop his vehicle and his wife took over driving.  Again he felt extremely fatigued and weak after the episode, episode lasted a few minutes.  Otherwise he is feeling well, he goes for a walk at least 1.5 miles on a daily basis with his wife which he did this morning as well without any dyspnea or chest pain.  I had seen him a month ago, his blood pressure was high at had also ordered lipids, he underwent routine treadmill stress test, echocardiogram and Zio patch event monitoring and presents for follow-up.  No new symptoms, since last office visit a month ago he has not had any further episodes.   Past Medical History:  Diagnosis Date  . Back pain, chronic   . Degenerative disk disease   . Hypotension    Past Surgical History:  Procedure Laterality Date  . ORIF FOOT FRACTURE     Family History  Problem Relation Age of Onset  . Alzheimer's disease Mother     Social History   Tobacco Use  . Smoking status: Former Smoker    Types: Cigarettes    Quit date: 2020    Years since quitting: 2.2  . Smokeless tobacco: Never Used  Substance Use Topics  . Alcohol use: Yes    Alcohol/week: 1.0 standard drink     Types: 1 Glasses of wine per week    Comment: occasionally    Marital Status: Married  ROS  Review of Systems  Cardiovascular: Positive for palpitations and syncope (near syncope). Negative for chest pain, dyspnea on exertion and leg swelling.  Musculoskeletal: Positive for back pain.  Gastrointestinal: Negative for melena.  Neurological: Positive for dizziness.   Objective  Blood pressure (!) 157/94, pulse 94, temperature 98 F (36.7 C), temperature source Temporal, resp. rate 16, height 5\' 10"  (1.778 m), weight 210 lb 6.4 oz (95.4 kg), SpO2 99 %.  Vitals with BMI 12/14/2020 11/13/2020 11/20/2013  Height 5\' 10"  8\' 0"  -  Weight 210 lbs 6 oz 209 lbs 3 oz -  BMI 30.19 15.96 -  Systolic 157 155 01/20/2014  Diastolic 94 101 88  Pulse 94 77 73   Orthostatic VS for the past 72 hrs (Last 3 readings):  Patient Position BP Location Cuff Size  12/14/20 1150 Sitting Left Arm Normal      Physical Exam Cardiovascular:     Rate and Rhythm: Normal rate and regular rhythm.     Pulses: Intact distal pulses.     Heart sounds: Normal heart sounds. No murmur heard. No gallop.      Comments: No leg  edema, no JVD. Pulmonary:     Effort: Pulmonary effort is normal.     Breath sounds: Normal breath sounds.  Abdominal:     General: Bowel sounds are normal.     Palpations: Abdomen is soft.    Laboratory examination:   Recent Labs    11/14/20 0835  NA 142  K 5.4*  CL 105  CO2 21  GLUCOSE 104*  BUN 17  CREATININE 1.16  CALCIUM 9.8   CrCl cannot be calculated (Patient's most recent lab result is older than the maximum 21 days allowed.).  CMP Latest Ref Rng & Units 11/14/2020 11/20/2013  Glucose 65 - 99 mg/dL 161(W104(H) 960(A115(H)  BUN 8 - 27 mg/dL 17 10  Creatinine 5.400.76 - 1.27 mg/dL 9.811.16 1.910.94  Sodium 478134 - 144 mmol/L 142 141  Potassium 3.5 - 5.2 mmol/L 5.4(H) 4.1  Chloride 96 - 106 mmol/L 105 106  CO2 20 - 29 mmol/L 21 23  Calcium 8.6 - 10.2 mg/dL 9.8 9.2  Total Protein 6.0 - 8.5 g/dL 6.5 -  Total  Bilirubin 0.0 - 1.2 mg/dL 0.5 -  Alkaline Phos 44 - 121 IU/L 61 -  AST 0 - 40 IU/L 18 -  ALT 0 - 44 IU/L 27 -   CBC Latest Ref Rng & Units 11/14/2020 11/20/2013  WBC 3.4 - 10.8 x10E3/uL 6.3 12.0(H)  Hemoglobin 13.0 - 17.7 g/dL 29.516.4 62.116.6  Hematocrit 30.837.5 - 51.0 % 48.7 47.2  Platelets 150 - 450 x10E3/uL 237 224    Lipid Panel Recent Labs    11/14/20 0835  CHOL 263*  TRIG 107  LDLCALC 194*  HDL 50    HEMOGLOBIN A1C No results found for: HGBA1C, MPG TSH Recent Labs    11/14/20 0835  TSH 1.480    External labs:    Hemoglobin 17.500 G/ 05/15/2020 Platelets 176.000 X1 05/15/2020  Creatinine, Serum 0.910 MG/ 05/15/2020 ALT (SGPT) 22.000 IU/ 05/15/2020  Medications and allergies  No Known Allergies   Outpatient Medications Prior to Visit  Medication Sig Dispense Refill  . Ascorbic Acid (VITAMIN C) 1000 MG tablet Take 1,000 mg by mouth daily.    . ASCORBIC ACID PO Take 2,000 mg by mouth daily.    Marland Kitchen. aspirin EC 325 MG tablet Take 650 mg by mouth daily as needed for moderate pain.    . Omega-3 Fatty Acids ([employer] FISH OIL) 1000 MG CAPS Take 1 capsule by mouth daily.     No facility-administered medications prior to visit.   Radiology:   No results found.  Cardiac Studies:   Zio Patch Extended out patient EKG monitoring 8 days starting 11/14/2020: Predominant rhythm was normal sinus rhythm.  Minimum heart rate was 34 and maximum heart rate was 137 bpm with average heart rate of 76 bpm. 2 episode of Mobitz 2 AV block lasting 3 seconds at 653 and 6:59 PM, asymptomatic. Atrial tachycardia, isolated PACs and PVCs and rare atrial couplets noted. No symptoms reported.  Exercise treadmill stress test 11/22/2020: Exercise treadmill stress test performed using Bruce protocol.  Patient reached 8.4 METS, and 106% of age predicted maximum heart rate.  Exercise capacity was low.  No chest pain reported.  Normal heart rate and hemodynamic response. Stress EKG revealed no ischemic  changes. Low risk study.  Echocardiogram 11/21/2020: Normal LV systolic function with visual EF 55-60%. Left ventricle cavity is normal in size. Normal global wall motion. Normal diastolic filling pattern, normal LAP.  Mild (Grade I) aortic regurgitation. Trace tricuspid regurgitation. No evidence  of pulmonary hypertension. No prior study for comparison.   EKG:     EKG 11/13/2020: Normal sinus rhythm at rate of 72 bpm, normal axis, no evidence of ischemia, normal EKG. Frequent PACs.       Assessment     ICD-10-CM   1. Mobitz type 2 second degree AV block  I44.1 Ambulatory referral to Cardiac Electrophysiology  2. Near syncope  R55 Ambulatory referral to Cardiac Electrophysiology    PCV CAROTID DUPLEX (BILATERAL)  3. Hypercholesteremia  E78.00 Lipid Panel With LDL/HDL Ratio    LDL cholesterol, direct    LDL cholesterol, direct    Lipid Panel With LDL/HDL Ratio  4. Hyperglycemia  R73.9 Hgb A1c w/o eAG    Hgb A1c w/o eAG    There are no discontinued medications.  Meds ordered this encounter  Medications  . amLODipine (NORVASC) 10 MG tablet    Sig: Take 1 tablet (10 mg total) by mouth every evening.    Dispense:  30 tablet    Refill:  2  . rosuvastatin (CRESTOR) 20 MG tablet    Sig: Take 1 tablet (20 mg total) by mouth daily.    Dispense:  30 tablet    Refill:  2   Orders Placed This Encounter  Procedures  . Lipid Panel With LDL/HDL Ratio    Standing Status:   Future    Number of Occurrences:   1    Standing Expiration Date:   12/14/2021  . LDL cholesterol, direct    Standing Status:   Future    Number of Occurrences:   1    Standing Expiration Date:   12/14/2021  . Hgb A1c w/o eAG    Standing Status:   Future    Number of Occurrences:   1    Standing Expiration Date:   12/14/2021  . Ambulatory referral to Cardiac Electrophysiology    Referral Priority:   Routine    Referral Type:   Consultation    Referral Reason:   Specialty Services Required    Requested  Specialty:   Cardiology    Number of Visits Requested:   1   Recommendations:   Kengo Sturges Dinning is a 70 y.o. [place] male with no significant prior cardiovascular history except for degenerative disc disease, over the past 2 weeks has noticed sudden onset of dizzy spells and near syncope.  First episode occurred while he was working in his yard, on 10/26/2020, suddenly felt he was going to pass out.  Episode lasted for about a minute or so then he felt extremely fatigued and weak.  A week later that his on 11/03/2020, while he was driving to [place], suddenly felt he is going to pass out.  He had to pull over and his wife drove the rest of the way.  Symptoms are very classic for sudden onset near syncope, appears to be cardiogenic.  I reviewed the Zio patch event monitoring which reveals 2 episodes of Mobitz 2 AV block.  He was completely asymptomatic as the episodes were very brief with only one missed beat.  He has not had any further episodes of syncope or near syncope.  I discussed with him extensively that he will either need a pacemaker if his symptoms correlated with the heart block or we should consider a loop recorder implantation.  I will obtain EP consult to see whether he would be appropriate candidate for proceeding directly with a pacemaker.  If not I will set him up for loop recorder  implantation in our office.  I reviewed his echocardiogram and treadmill stress test and reassured him.  His blood pressure continues to be elevated, lipids are markedly elevated.  Lengthy discussion with the patient regarding primary prevention.  He was initially skeptical of starting any new medications.  I have started him on amlodipine 10 mg in the evening along with Crestor 20 mg in the evening, will obtain lipids in about 6 weeks to 8 weeks and I will see him back then.     This was a 40-minute office visit encounter discussing the complexity of his care.    Yates Decamp, MD, [facility] 12/14/2020, 9:15  PM Office: [phone]

## 2020-12-15 ENCOUNTER — Ambulatory Visit: Payer: Medicare Other

## 2020-12-15 DIAGNOSIS — R55 Syncope and collapse: Secondary | ICD-10-CM

## 2020-12-25 ENCOUNTER — Encounter: Payer: Self-pay | Admitting: Internal Medicine

## 2020-12-25 NOTE — Telephone Encounter (Signed)
Results not available yet.

## 2021-02-03 LAB — LIPID PANEL WITH LDL/HDL RATIO
Cholesterol, Total: 141 mg/dL (ref 100–199)
HDL: 48 mg/dL (ref >39)
LDL Chol Calc (NIH): 79 mg/dL (ref 0–99)
LDL/HDL Ratio: 1.6 ratio (ref 0.0–3.6)
Triglycerides: 71 mg/dL (ref 0–149)
VLDL Cholesterol Cal: 14 mg/dL (ref 5–40)

## 2021-02-03 LAB — LDL CHOLESTEROL, DIRECT: LDL Direct: 79 mg/dL (ref 0–99)

## 2021-02-03 LAB — HGB A1C W/O EAG: Hgb A1c MFr Bld: 5.3 % (ref 4.8–5.6)

## 2021-02-09 ENCOUNTER — Ambulatory Visit: Payer: Medicare Other | Admitting: Cardiology

## 2021-02-26 ENCOUNTER — Encounter: Payer: Self-pay | Admitting: Cardiology

## 2021-02-26 ENCOUNTER — Ambulatory Visit: Payer: Medicare Other | Admitting: Cardiology

## 2021-02-26 ENCOUNTER — Other Ambulatory Visit: Payer: Self-pay

## 2021-02-26 VITALS — BP 123/86 | HR 86 | Temp 98.2°F | Resp 16 | Ht 70.0 in | Wt 193.8 lb

## 2021-02-26 DIAGNOSIS — I441 Atrioventricular block, second degree: Secondary | ICD-10-CM

## 2021-02-26 DIAGNOSIS — E7800 Pure hypercholesterolemia, unspecified: Secondary | ICD-10-CM

## 2021-02-26 DIAGNOSIS — R55 Syncope and collapse: Secondary | ICD-10-CM

## 2021-02-26 DIAGNOSIS — I10 Essential (primary) hypertension: Secondary | ICD-10-CM

## 2021-02-26 MED ORDER — AMLODIPINE BESYLATE 10 MG PO TABS: 10.0000 mg | ORAL_TABLET | Freq: Every evening | ORAL | 3 refills | Status: DC

## 2021-02-26 MED ORDER — ROSUVASTATIN CALCIUM 20 MG PO TABS: 20.0000 mg | ORAL_TABLET | Freq: Every day | ORAL | 3 refills | Status: DC

## 2021-02-26 NOTE — Progress Notes (Signed)
Primary Physician/Referring:  Kaleen Mask, MD  Patient ID: Antonio Elliott, male    DOB: Feb 03, 1951, 70 y.o.   MRN: [id number]  Chief Complaint  Patient presents with   Hypertension   Hyperlipidemia    Mobitz 2 AV block   Follow-up    2 months   HPI:    Antonio Elliott  is a 70 y.o. Caucasian male with no significant prior cardiovascular history, recent diagnosis of hypertension and hyperlipidemia and with history of sudden onset of dizzy spells and near syncope.  First episode occurred while he was working in his yard, on 10/26/2020, suddenly felt he was going to pass out.  Episode lasted for about a minute or so then he felt extremely fatigued and weak.  A week later that his on 11/03/2020, while he was driving to [place], suddenly felt he is going to pass out.  He had to stop his vehicle and his wife took over driving.  Again he felt extremely fatigued and weak after the episode, episode lasted a few minutes.  He has had few more episodes similarly in the past.  He established with me in February 2022.  Presently asymptomatic, feels well and he goes for a walk at least 1.5 miles on a daily basis with his wife which he did this morning as well without any dyspnea or chest pain.    Past Medical History:  Diagnosis Date   Back pain, chronic    Degenerative disk disease    Hyperlipidemia    Hypertension    Hypotension    Past Surgical History:  Procedure Laterality Date   ORIF FOOT FRACTURE     Family History  Problem Relation Age of Onset   Alzheimer's disease Mother     Social History   Tobacco Use   Smoking status: Former    Pack years: 0.00    Types: Cigarettes    Quit date: 2020    Years since quitting: 2.4   Smokeless tobacco: Never  Substance Use Topics   Alcohol use: Yes    Alcohol/week: 1.0 standard drink    Types: 1 Glasses of wine per week    Comment: occasionally    Marital Status: Married  ROS  Review of Systems  Cardiovascular:  Negative  for chest pain, dyspnea on exertion and leg swelling.  Musculoskeletal:  Positive for arthritis and back pain.  Gastrointestinal:  Negative for melena.  Objective  Blood pressure 123/86, pulse 86, temperature 98.2 F (36.8 C), temperature source Temporal, resp. rate 16, height 5\' 10"  (1.778 m), weight 193 lb 12.8 oz (87.9 kg), SpO2 97 %.  Vitals with BMI 02/26/2021 12/14/2020 11/13/2020  Height 5\' 10"  5\' 10"  8\' 0"   Weight 193 lbs 13 oz 210 lbs 6 oz 209 lbs 3 oz  BMI 27.81 30.19 15.96  Systolic 123 157 11/15/2020  Diastolic 86 94 101  Pulse 86 94 77    Physical Exam Cardiovascular:     Rate and Rhythm: Normal rate and regular rhythm.     Pulses: Intact distal pulses.     Heart sounds: Normal heart sounds. No murmur heard.   No gallop.     Comments: No leg edema, no JVD. Pulmonary:     Effort: Pulmonary effort is normal.     Breath sounds: Normal breath sounds.  Abdominal:     General: Bowel sounds are normal.     Palpations: Abdomen is soft.   Laboratory examination:   Recent Labs  11/14/20 0835  NA 142  K 5.4*  CL 105  CO2 21  GLUCOSE 104*  BUN 17  CREATININE 1.16  CALCIUM 9.8   CrCl cannot be calculated (Patient's most recent lab result is older than the maximum 21 days allowed.).  CMP Latest Ref Rng & Units 11/14/2020 11/20/2013  Glucose 65 - 99 mg/dL 166(A) 630(Z)  BUN 8 - 27 mg/dL 17 10  Creatinine 6.01 - 1.27 mg/dL 0.93 2.35  Sodium 573 - 144 mmol/L 142 141  Potassium 3.5 - 5.2 mmol/L 5.4(H) 4.1  Chloride 96 - 106 mmol/L 105 106  CO2 20 - 29 mmol/L 21 23  Calcium 8.6 - 10.2 mg/dL 9.8 9.2  Total Protein 6.0 - 8.5 g/dL 6.5 -  Total Bilirubin 0.0 - 1.2 mg/dL 0.5 -  Alkaline Phos 44 - 121 IU/L 61 -  AST 0 - 40 IU/L 18 -  ALT 0 - 44 IU/L 27 -   CBC Latest Ref Rng & Units 11/14/2020 11/20/2013  WBC 3.4 - 10.8 x10E3/uL 6.3 12.0(H)  Hemoglobin 13.0 - 17.7 g/dL 22.0 25.4  Hematocrit 27.0 - 51.0 % 48.7 47.2  Platelets 150 - 450 x10E3/uL 237 224    Lipid Panel Recent Labs     11/14/20 0835 02/02/21 0833 02/02/21 0835  CHOL 263*  --  141  TRIG 107  --  71  LDLCALC 194*  --  79  HDL 50  --  48  LDLDIRECT  --  79  --     HEMOGLOBIN A1C Lab Results  Component Value Date   HGBA1C 5.3 02/02/2021   TSH Recent Labs    11/14/20 0835  TSH 1.480    External labs:    Hemoglobin 17.500 G/ 05/15/2020 Platelets 176.000 X1 05/15/2020  Creatinine, Serum 0.910 MG/ 05/15/2020 ALT (SGPT) 22.000 IU/ 05/15/2020  Medications and allergies  No Known Allergies   Outpatient Medications Prior to Visit  Medication Sig Dispense Refill   Ascorbic Acid (VITAMIN C) 1000 MG tablet Take 1,000 mg by mouth daily.     aspirin EC 325 MG tablet Take 650 mg by mouth daily as needed for moderate pain.     Omega-3 Fatty Acids ([employer] FISH OIL) 1000 MG CAPS Take 1 capsule by mouth daily.     amLODipine (NORVASC) 10 MG tablet Take 1 tablet (10 mg total) by mouth every evening. 30 tablet 2   rosuvastatin (CRESTOR) 20 MG tablet Take 1 tablet (20 mg total) by mouth daily. 30 tablet 2   ASCORBIC ACID PO Take 2,000 mg by mouth daily.     No facility-administered medications prior to visit.   Radiology:   No results found.  Cardiac Studies:   Zio Patch Extended out patient EKG monitoring 8 days starting 11/14/2020: Predominant rhythm was normal sinus rhythm.  Minimum heart rate was 34 and maximum heart rate was 137 bpm with average heart rate of 76 bpm. 2 episode of Mobitz 2 AV block lasting 3 seconds at 653 and 6:59 PM, asymptomatic. Atrial tachycardia, isolated PACs and PVCs and rare atrial couplets noted. No symptoms reported.  Exercise treadmill stress test 11/22/2020: Exercise treadmill stress test performed using Bruce protocol.  Patient reached 8.4 METS, and 106% of age predicted maximum heart rate.  Exercise capacity was low.  No chest pain reported.  Normal heart rate and hemodynamic response. Stress EKG revealed no ischemic changes. Low risk study.  Echocardiogram  11/21/2020: Normal LV systolic function with visual EF 55-60%. Left ventricle cavity is normal in  size. Normal global wall motion. Normal diastolic filling pattern, normal LAP.  Mild (Grade I) aortic regurgitation. Trace tricuspid regurgitation. No evidence of pulmonary hypertension. No prior study for comparison.  Carotid artery duplex 12/15/2020: No hemodynamically significant arterial disease in the internal carotid artery bilaterally. No significant plaque burden noted. Antegrade right vertebral artery flow. Antegrade left vertebral artery flow.   EKG:     EKG 11/13/2020: Normal sinus rhythm at rate of 72 bpm, normal axis, no evidence of ischemia, normal EKG. Frequent PACs.       Assessment     ICD-10-CM   1. Hypercholesteremia  E78.00 rosuvastatin (CRESTOR) 20 MG tablet    2. Primary hypertension  I10 amLODipine (NORVASC) 10 MG tablet    3. Mobitz type 2 second degree AV block  I44.1     4. Near syncope  R55        Medications Discontinued During This Encounter  Medication Reason   ASCORBIC ACID PO Error   amLODipine (NORVASC) 10 MG tablet Reorder   rosuvastatin (CRESTOR) 20 MG tablet Reorder    Meds ordered this encounter  Medications   rosuvastatin (CRESTOR) 20 MG tablet    Sig: Take 1 tablet (20 mg total) by mouth daily.    Dispense:  90 tablet    Refill:  3   amLODipine (NORVASC) 10 MG tablet    Sig: Take 1 tablet (10 mg total) by mouth every evening.    Dispense:  90 tablet    Refill:  3    No orders of the defined types were placed in this encounter.  Recommendations:   Antonio Elliott is a 70 y.o. Caucasian male with no significant prior cardiovascular history, recent diagnosis of hypertension and hyperlipidemia and with history of sudden onset of dizzy spells and near syncope.  First episode occurred while he was working in his yard, on 10/26/2020, suddenly felt he was going to pass out.  Episode lasted for about a minute or so then he felt extremely  fatigued and weak.  A week later that his on 11/03/2020, while he was driving to [place], suddenly felt he is going to pass out.  He had to pull over and his wife drove the rest of the way.  He has had several minor episodes, he has not had any further episodes since he started seeing me in February 2022.  His symptoms of rapid onset are clearly cardiogenic.  Zio patch event monitoring revealed 2 episodes of asymptomatic Mobitz 2 AV block, brief and only 1 missed beat.  As he has not had any further episodes, I have recommended continued observation for now.  I have discussed extensively regarding possibility of placement of a loop recorder for close monitoring, patient states that he is doing well hence would like to just continue observation for now.  He is tolerating amlodipine and also rosuvastatin without any side effects.  Labs reviewed, lipids and excellent control, blood pressure is also very well controlled.  Hence advised him to continue the same.  I will see him back in 6 months for follow-up.    Antonio Decamp, MD, [facility] 02/26/2021, 11:01 PM Office: [phone]

## 2022-01-07 ENCOUNTER — Other Ambulatory Visit: Payer: Self-pay

## 2022-01-07 ENCOUNTER — Encounter (HOSPITAL_COMMUNITY): Payer: Self-pay | Admitting: Emergency Medicine

## 2022-01-07 ENCOUNTER — Emergency Department (HOSPITAL_COMMUNITY)
Admission: EM | Admit: 2022-01-07 | Discharge: 2022-01-07 | Disposition: A | Discharge status: Home / Self Care | Payer: Medicare Other | Attending: Emergency Medicine | Admitting: Emergency Medicine

## 2022-01-07 ENCOUNTER — Emergency Department (HOSPITAL_COMMUNITY): Payer: Medicare Other

## 2022-01-07 DIAGNOSIS — M25551 Pain in right hip: Secondary | ICD-10-CM | POA: Insufficient documentation

## 2022-01-07 DIAGNOSIS — M545 Low back pain, unspecified: Secondary | ICD-10-CM | POA: Insufficient documentation

## 2022-01-07 NOTE — ED Provider Notes (Signed)
?[facility] EMERGENCY DEPARTMENT ?Provider Note ? ? ?CSN: [id number] ?Arrival date & time: 01/07/22  3149 ? ?  ? ?History ? ?Chief Complaint  ?Patient presents with  ? Back Pain  ? Hip Pain  ?  Right  ? ? ?Antonio Elliott is a 71 y.o. male. ? ?Patient presents with complaint of pain to the right lower back/upper buttock region radiates down to the right thigh.  He states been going on for about a month to 6 weeks, but without improvement.  Denies any significant fall or trauma that he can recall.  No reports of fevers or cough or vomiting or diarrhea.  No unintentional weight loss.  No new numbness or weakness.  Patient is able to ambulate but with some discomfort.  No bowel or bladder dysfunction reported. ? ? ?  ? ?Home Medications ?Prior to Admission medications   ?Medication Sig Start Date End Date Taking? Authorizing Provider  ?amLODipine (NORVASC) 10 MG tablet Take 1 tablet (10 mg total) by mouth every evening. 02/26/21 02/21/22 Yes Yates Decamp, MD  ?Ascorbic Acid (VITAMIN C) 1000 MG tablet Take 2,000 mg by mouth daily.   Yes [provider]  ?ibuprofen (ADVIL) 200 MG tablet Take 800 mg by mouth 3 (three) times daily as needed for mild pain.   Yes [provider]  ?Omega-3 Fatty Acids (FISH OIL) 1000 MG CAPS Take 1,000 mg by mouth daily.   Yes [provider]  ?rosuvastatin (CRESTOR) 20 MG tablet Take 1 tablet (20 mg total) by mouth daily. 02/26/21 02/21/22 Yes Yates Decamp, MD  ?   ? ?Allergies    ?Patient has no known allergies.   ? ?Review of Systems   ?Review of Systems  ?Constitutional:  Negative for fever.  ?HENT:  Negative for ear pain and sore throat.   ?Eyes:  Negative for pain.  ?Respiratory:  Negative for cough.   ?Cardiovascular:  Negative for chest pain.  ?Gastrointestinal:  Negative for abdominal pain.  ?Genitourinary:  Negative for flank pain.  ?Musculoskeletal:  Positive for back pain.  ?Skin:  Negative for color change and rash.  ?Neurological:  Negative for  syncope.  ?All other systems reviewed and are negative. ? ?Physical Exam ?Updated Vital Signs ?BP (!) 128/92   Pulse 72   Temp 98.2 ?F (36.8 ?C)   Resp 18   Ht 5\' 9"  (1.753 m)   Wt 83.9 kg   SpO2 99%   BMI 27.32 kg/m?  ?Physical Exam ?Constitutional:   ?   Appearance: He is well-developed.  ?HENT:  ?   Head: Normocephalic.  ?   Nose: Nose normal.  ?Eyes:  ?   Extraocular Movements: Extraocular movements intact.  ?Cardiovascular:  ?   Rate and Rhythm: Normal rate.  ?Pulmonary:  ?   Effort: Pulmonary effort is normal.  ?Musculoskeletal:  ?   Comments: Negative straight leg test bilateral lower extremities. ? ?Otherwise neurovascular intact bilateral lower extremities, normal pulses normal cap refill. ? ?No C or T or L-spine midline step-offs or tenderness noted. ? ?Moderate right lateral paraspinal tenderness in the L4-5 region noted.  ?Skin: ?   Coloration: Skin is not jaundiced.  ?Neurological:  ?   General: No focal deficit present.  ?   Mental Status: He is alert and oriented to person, place, and time. Mental status is at baseline.  ?   Cranial Nerves: No cranial nerve deficit.  ?   Motor: No weakness.  ?   Gait: Gait normal.  ? ? ?  ED Results / Procedures / Treatments   ?Labs ?(all labs ordered are listed, but only abnormal results are displayed) ?Labs Reviewed - No data to display ? ?EKG ?None ? ?Radiology ?DG Pelvis 1-2 Views ? ?Result Date: 01/07/2022 ?CLINICAL DATA:  Complaints of pain around the right hip radiating to coccyx. EXAM: PELVIS - 1-2 VIEW COMPARISON:  None. FINDINGS: There is no evidence of pelvic fracture or diastasis. No pelvic bone lesions are seen. Mild spondylosis of the visualized lumbosacral spine. IMPRESSION: Mild spondylosis of the lumbosacral spine. Otherwise the study is unremarkable. Electronically Signed   By: Marjo Bicker M.D.   On: 01/07/2022 09:35   ? ?Procedures ?Procedures  ? ? ?Medications Ordered in ED ?Medications - No data to display ? ?ED Course/ Medical Decision  Making/ A&P ?  ?                        ?Medical Decision Making ?Amount and/or Complexity of Data Reviewed ?Radiology: ordered. ? ? ?Patient declined pain medications here. ? ?Chart review shows office visit February 26, 2021 with cardiology. ? ?Work-up includes x-rays of the pelvis which were unremarkable.  Patient continues to decline medications here.  Discharged home in stable condition.  Advised outpatient follow-up with his doctor this week.  Advised immediate return for new numbness weakness or any additional concerns. ? ? ? ? ? ? ? ?Final Clinical Impression(s) / ED Diagnoses ?Final diagnoses:  ?Right-sided low back pain without sciatica, unspecified chronicity  ? ? ?Rx / DC Orders ?ED Discharge Orders   ? ? None  ? ?  ? ? ?  ?Cheryll Cockayne, MD ?01/07/22 1021 ? ?

## 2022-01-07 NOTE — Discharge Instructions (Signed)
Call your primary care doctor or specialist as discussed in the next 2-3 days.  Continue Tylenol and Motrin as needed at home. ? ?Return immediately back to the ER if: ? ?Your symptoms worsen within the next 12-24 hours. ?You develop new symptoms such as new fevers, persistent vomiting, new pain, shortness of breath, or new weakness or numbness, or if you have any other concerns. ? ?

## 2022-01-07 NOTE — ED Triage Notes (Signed)
Patient coming from home, complaint of lower back and right hip/leg pain. States has been there for approx 1 month but worse recently. Ambulatory @ triage. ?

## 2022-01-08 ENCOUNTER — Other Ambulatory Visit: Payer: Self-pay | Admitting: Cardiology

## 2022-01-08 DIAGNOSIS — I10 Essential (primary) hypertension: Secondary | ICD-10-CM

## 2022-01-08 DIAGNOSIS — E7800 Pure hypercholesterolemia, unspecified: Secondary | ICD-10-CM

## 2022-02-21 NOTE — Progress Notes (Unsigned)
Primary Physician/Referring:  Kaleen MaskElkins, Wilson Oliver, MD  Patient ID: Quentin OreKenneth R Wohlfarth, male    DOB: 09-14-1951, 71 y.o.   MRN: [id number]  No chief complaint on file.  HPI:    Quentin OreKenneth R Sample  is a 71 y.o. Caucasian male with no significant prior cardiovascular history, recent diagnosis of hypertension and hyperlipidemia and with history of sudden onset of dizzy spells and near syncope.  First episode occurred while he was working in his yard, on 10/26/2020, suddenly felt he was going to pass out.  Episode lasted for about a minute or so then he felt extremely fatigued and weak.  A week later that his on 11/03/2020, while he was driving to [place], suddenly felt he is going to pass out.  He had to stop his vehicle and his wife took over driving.  Again he felt extremely fatigued and weak after the episode, episode lasted a few minutes.  He has had few more episodes similarly in the past.  He established with me in February 2022.  Presently asymptomatic, feels well and he goes for a walk at least 1.5 miles on a daily basis with his wife which he did this morning as well without any dyspnea or chest pain.    Past Medical History:  Diagnosis Date   Back pain, chronic    Degenerative disk disease    Hyperlipidemia    Hypertension    Hypotension    Past Surgical History:  Procedure Laterality Date   ORIF FOOT FRACTURE     Family History  Problem Relation Age of Onset   Alzheimer's disease Mother     Social History   Tobacco Use   Smoking status: Former    Types: Cigarettes    Quit date: 2020    Years since quitting: 3.4   Smokeless tobacco: Never  Substance Use Topics   Alcohol use: Yes    Alcohol/week: 1.0 standard drink of alcohol    Types: 1 Glasses of wine per week    Comment: occasionally    Marital Status: Married  ROS  Review of Systems  Cardiovascular:  Negative for chest pain, dyspnea on exertion and leg swelling.  Musculoskeletal:  Positive for arthritis and back  pain.  Gastrointestinal:  Negative for melena.   Objective  There were no vitals taken for this visit.     01/07/2022   10:15 AM 01/07/2022    9:45 AM 01/07/2022    9:15 AM  Vitals with BMI  Systolic 128 142 [id number]  Diastolic 92 97 88  Pulse 72 66 70    Physical Exam Cardiovascular:     Rate and Rhythm: Normal rate and regular rhythm.     Pulses: Intact distal pulses.     Heart sounds: Normal heart sounds. No murmur heard.    No gallop.     Comments: No leg edema, no JVD. Pulmonary:     Effort: Pulmonary effort is normal.     Breath sounds: Normal breath sounds.  Abdominal:     General: Bowel sounds are normal.     Palpations: Abdomen is soft.   Laboratory examination:   No results for input(s): "NA", "K", "CL", "CO2", "GLUCOSE", "BUN", "CREATININE", "CALCIUM", "GFRNONAA", "GFRAA" in the last 8760 hours.  CrCl cannot be calculated (Patient's most recent lab result is older than the maximum 21 days allowed.).     Latest Ref Rng & Units 11/14/2020    8:35 AM 11/20/2013    5:57 AM  CMP  Glucose  65 - 99 mg/dL 992  426   BUN 8 - 27 mg/dL 17  10   Creatinine 8.34 - 1.27 mg/dL 1.96  2.22   Sodium 979 - 144 mmol/L 142  141   Potassium 3.5 - 5.2 mmol/L 5.4  4.1   Chloride 96 - 106 mmol/L 105  106   CO2 20 - 29 mmol/L 21  23   Calcium 8.6 - 10.2 mg/dL 9.8  9.2   Total Protein 6.0 - 8.5 g/dL 6.5    Total Bilirubin 0.0 - 1.2 mg/dL 0.5    Alkaline Phos 44 - 121 IU/L 61    AST 0 - 40 IU/L 18    ALT 0 - 44 IU/L 27        Latest Ref Rng & Units 11/14/2020    8:35 AM 11/20/2013    5:57 AM  CBC  WBC 3.4 - 10.8 x10E3/uL 6.3  12.0   Hemoglobin 13.0 - 17.7 g/dL 89.2  11.9   Hematocrit 37.5 - 51.0 % 48.7  47.2   Platelets 150 - 450 x10E3/uL 237  224     Lipid Panel No results for input(s): "CHOL", "TRIG", "LDLCALC", "VLDL", "HDL", "CHOLHDL", "LDLDIRECT" in the last 8760 hours.   HEMOGLOBIN A1C Lab Results  Component Value Date   HGBA1C 5.3 02/02/2021   TSH No results for  input(s): "TSH" in the last 8760 hours.   External labs:    Hemoglobin 17.500 G/ 05/15/2020 Platelets 176.000 X1 05/15/2020  Creatinine, Serum 0.910 MG/ 05/15/2020 ALT (SGPT) 22.000 IU/ 05/15/2020  Medications and allergies  No Known Allergies   Outpatient Medications Prior to Visit  Medication Sig Dispense Refill   amLODipine (NORVASC) 10 MG tablet TAKE 1 TABLET BY MOUTH IN  THE EVENING 90 tablet 3   Ascorbic Acid (VITAMIN C) 1000 MG tablet Take 2,000 mg by mouth daily.     ibuprofen (ADVIL) 200 MG tablet Take 800 mg by mouth 3 (three) times daily as needed for mild pain.     Omega-3 Fatty Acids (FISH OIL) 1000 MG CAPS Take 1,000 mg by mouth daily.     rosuvastatin (CRESTOR) 20 MG tablet TAKE 1 TABLET BY MOUTH  DAILY 90 tablet 3   No facility-administered medications prior to visit.   Radiology:   No results found.  Cardiac Studies:   Zio Patch Extended out patient EKG monitoring 8 days starting 11/14/2020: Predominant rhythm was normal sinus rhythm.  Minimum heart rate was 34 and maximum heart rate was 137 bpm with average heart rate of 76 bpm. 2 episode of Mobitz 2 AV block lasting 3 seconds at 653 and 6:59 PM, asymptomatic. Atrial tachycardia, isolated PACs and PVCs and rare atrial couplets noted. No symptoms reported.  Exercise treadmill stress test 11/22/2020: Exercise treadmill stress test performed using Bruce protocol.  Patient reached 8.4 METS, and 106% of age predicted maximum heart rate.  Exercise capacity was low.  No chest pain reported.  Normal heart rate and hemodynamic response. Stress EKG revealed no ischemic changes. Low risk study.  Echocardiogram 11/21/2020: Normal LV systolic function with visual EF 55-60%. Left ventricle cavity is normal in size. Normal global wall motion. Normal diastolic filling pattern, normal LAP.  Mild (Grade I) aortic regurgitation. Trace tricuspid regurgitation. No evidence of pulmonary hypertension. No prior study for  comparison.  Carotid artery duplex 12/15/2020: No hemodynamically significant arterial disease in the internal carotid artery bilaterally. No significant plaque burden noted. Antegrade right vertebral artery flow. Antegrade left vertebral artery flow.  EKG:     EKG 11/13/2020: Normal sinus rhythm at rate of 72 bpm, normal axis, no evidence of ischemia, normal EKG. Frequent PACs.       Assessment   No diagnosis found.    There are no discontinued medications.   No orders of the defined types were placed in this encounter.   No orders of the defined types were placed in this encounter.  Recommendations:   Matthias Bogus Germond is a 71 y.o. Caucasian male with no significant prior cardiovascular history, recent diagnosis of hypertension and hyperlipidemia and with history of sudden onset of dizzy spells and near syncope.  First episode occurred while he was working in his yard, on 10/26/2020, suddenly felt he was going to pass out.  Episode lasted for about a minute or so then he felt extremely fatigued and weak.  A week later that his on 11/03/2020, while he was driving to [place], suddenly felt he is going to pass out.  He had to pull over and his wife drove the rest of the way.  He has had several minor episodes, he has not had any further episodes since he started seeing me in February 2022.  His symptoms of rapid onset are clearly cardiogenic.  Zio patch event monitoring revealed 2 episodes of asymptomatic Mobitz 2 AV block, brief and only 1 missed beat.  As he has not had any further episodes, I have recommended continued observation for now.  I have discussed extensively regarding possibility of placement of a loop recorder for close monitoring, patient states that he is doing well hence would like to just continue observation for now.  He is tolerating amlodipine and also rosuvastatin without any side effects.  Labs reviewed, lipids and excellent control, blood pressure is also  very well controlled.  Hence advised him to continue the same.  I will see him back in 6 months for follow-up.    Rayford Halsted, MD, [facility] 02/21/2022, 11:33 AM Office: [phone]

## 2022-02-25 ENCOUNTER — Ambulatory Visit: Payer: Medicare Other | Admitting: Cardiology

## 2022-02-25 ENCOUNTER — Ambulatory Visit: Payer: Medicare Other | Admitting: Student

## 2022-02-25 ENCOUNTER — Encounter: Payer: Self-pay | Admitting: Student

## 2022-02-25 VITALS — BP 123/84 | HR 72 | Temp 98.0°F | Resp 17 | Ht 69.0 in | Wt 182.4 lb

## 2022-02-25 DIAGNOSIS — I441 Atrioventricular block, second degree: Secondary | ICD-10-CM

## 2022-02-25 DIAGNOSIS — I10 Essential (primary) hypertension: Secondary | ICD-10-CM

## 2022-02-25 DIAGNOSIS — E7800 Pure hypercholesterolemia, unspecified: Secondary | ICD-10-CM

## 2022-10-20 ENCOUNTER — Other Ambulatory Visit: Payer: Self-pay | Admitting: Cardiology

## 2022-10-20 DIAGNOSIS — E7800 Pure hypercholesterolemia, unspecified: Secondary | ICD-10-CM

## 2022-10-20 DIAGNOSIS — I10 Essential (primary) hypertension: Secondary | ICD-10-CM

## 2022-12-30 ENCOUNTER — Encounter: Payer: Self-pay | Admitting: Cardiology

## 2023-02-26 ENCOUNTER — Encounter: Payer: Self-pay | Admitting: Cardiology

## 2023-02-26 ENCOUNTER — Ambulatory Visit: Payer: Medicare Other | Admitting: Cardiology

## 2023-02-26 VITALS — BP 116/79 | HR 67 | Resp 16 | Ht 69.0 in | Wt 183.4 lb

## 2023-02-26 DIAGNOSIS — I10 Essential (primary) hypertension: Secondary | ICD-10-CM

## 2023-02-26 DIAGNOSIS — E7800 Pure hypercholesterolemia, unspecified: Secondary | ICD-10-CM

## 2023-02-26 NOTE — Progress Notes (Signed)
Primary Physician/Referring:  Kaleen Mask, MD  Patient ID: Antonio Elliott, male    DOB: [birth year], 72 y.o.   MRN: [id number]  Chief Complaint  Patient presents with   Hyperlipidemia   PACs   AV Block   Follow-up    1 year   HPI:    Antonio Elliott  is a 72 y.o. Caucasian male with hypertension and hyperlipidemia and with history of sudden onset of dizzy spells and near syncope. Cardiac monitor revealed asymptomatic Mobitz 2 AV block which was brief and therefore shared decision was to continue with watchful waiting.  Patient has remained asymptomatic since 10/2020 without dizziness or syncope. Near syncope.  Remains asymptomatic.  Past Medical History:  Diagnosis Date   Back pain, chronic    Degenerative disk disease    Hyperlipidemia    Hypertension    Hypotension    Past Surgical History:  Procedure Laterality Date   ORIF FOOT FRACTURE     Family History  Problem Relation Age of Onset   Alzheimer's disease Mother     Social History   Tobacco Use   Smoking status: Former    Types: Cigarettes    Quit date: 2020    Years since quitting: 4.4   Smokeless tobacco: Never  Substance Use Topics   Alcohol use: Yes    Alcohol/week: 1.0 standard drink of alcohol    Types: 1 Glasses of wine per week    Comment: occasionally    Marital Status: Married  ROS  Review of Systems  Cardiovascular:  Negative for chest pain, claudication, dyspnea on exertion, leg swelling, near-syncope, orthopnea, palpitations, paroxysmal nocturnal dyspnea and syncope.  Musculoskeletal:  Positive for arthritis and back pain.  Neurological:  Negative for dizziness.   Objective  Blood pressure 116/79, pulse 67, resp. rate 16, height 5\' 9"  (1.753 m), weight 183 lb 6.4 oz (83.2 kg), SpO2 97 %.     02/26/2023    9:07 AM 02/25/2022    8:37 AM 01/07/2022   10:15 AM  Vitals with BMI  Height 5\' 9"  5\' 9"    Weight 183 lbs 6 oz 182 lbs 6 oz   BMI 27.07 26.92   Systolic 116 123 536   Diastolic 79 84 92  Pulse 67 72 72    Physical Exam Neck:     Vascular: No carotid bruit or JVD.  Cardiovascular:     Rate and Rhythm: Normal rate and regular rhythm.     Pulses: Intact distal pulses.     Heart sounds: Normal heart sounds. No murmur heard.    No gallop.  Pulmonary:     Effort: Pulmonary effort is normal.     Breath sounds: Normal breath sounds.  Abdominal:     General: Bowel sounds are normal.     Palpations: Abdomen is soft.  Musculoskeletal:     Right lower leg: No edema.     Left lower leg: No edema.    Laboratory examination:       Latest Ref Rng & Units 11/14/2020    8:35 AM 11/20/2013    5:57 AM  CMP  Glucose 65 - 99 mg/dL 644  034   BUN 8 - 27 mg/dL 17  10   Creatinine 7.42 - 1.27 mg/dL 5.95  6.38   Sodium 756 - 144 mmol/L 142  141   Potassium 3.5 - 5.2 mmol/L 5.4  4.1   Chloride 96 - 106 mmol/L 105  106   CO2 20 -  29 mmol/L 21  23   Calcium 8.6 - 10.2 mg/dL 9.8  9.2   Total Protein 6.0 - 8.5 g/dL 6.5    Total Bilirubin 0.0 - 1.2 mg/dL 0.5    Alkaline Phos 44 - 121 IU/L 61    AST 0 - 40 IU/L 18    ALT 0 - 44 IU/L 27        Latest Ref Rng & Units 11/14/2020    8:35 AM 11/20/2013    5:57 AM  CBC  WBC 3.4 - 10.8 x10E3/uL 6.3  12.0   Hemoglobin 13.0 - 17.7 g/dL 16.1  09.6   Hematocrit 37.5 - 51.0 % 48.7  47.2   Platelets 150 - 450 x10E3/uL 237  224    Lab Results  Component Value Date   CHOL 141 02/02/2021   HDL 48 02/02/2021   LDLCALC 79 02/02/2021   LDLDIRECT 79 02/02/2021   TRIG 71 02/02/2021    HEMOGLOBIN A1C Lab Results  Component Value Date   HGBA1C 5.3 02/02/2021   Lab Results  Component Value Date   TSH 1.480 11/14/2020    Radiology:   No results found.  Cardiac Studies:   Zio Patch Extended out patient EKG monitoring 8 days starting 11/14/2020: Predominant rhythm was normal sinus rhythm.  Minimum heart rate was 34 and maximum heart rate was 137 bpm with average heart rate of 76 bpm. 2 episode of Mobitz 2 AV block  lasting 3 seconds at 653 and 6:59 PM, asymptomatic. Atrial tachycardia, isolated PACs and PVCs and rare atrial couplets noted. No symptoms reported.  Exercise treadmill stress test 11/22/2020: Exercise treadmill stress test performed using Bruce protocol.  Patient reached 8.4 METS, and 106% of age predicted maximum heart rate.  Exercise capacity was low.  No chest pain reported.  Normal heart rate and hemodynamic response. Stress EKG revealed no ischemic changes. Low risk study.  Echocardiogram 11/21/2020: Normal LV systolic function with visual EF 55-60%. Left ventricle cavity is normal in size. Normal global wall motion. Normal diastolic filling pattern, normal LAP.  Mild (Grade I) aortic regurgitation. Trace tricuspid regurgitation. No evidence of pulmonary hypertension. No prior study for comparison.  Carotid artery duplex 12/15/2020: No hemodynamically significant arterial disease in the internal carotid artery bilaterally. No significant plaque burden noted. Antegrade right vertebral artery flow. Antegrade left vertebral artery flow.  EKG:   EKG 02/26/2023: Normal sinus rhythm at the rate of 64 bpm with sinus arrhythmia, left atrial enlargement, normal axis.  No evidence of ischemia, normal EKG.  No significant change from 02/25/2022.       Allergies & Medications  No Known Allergies  Current Outpatient Medications:    amLODipine (NORVASC) 10 MG tablet, TAKE 1 TABLET BY MOUTH IN THE  EVENING, Disp: 100 tablet, Rfl: 2   Ascorbic Acid (VITAMIN C) 1000 MG tablet, Take 2,000 mg by mouth daily., Disp: , Rfl:    Coenzyme Q10 (COQ-10 PO), Take 100 mg by mouth daily., Disp: , Rfl:    ibuprofen (ADVIL) 200 MG tablet, Take 800 mg by mouth 3 (three) times daily as needed for mild pain., Disp: , Rfl:    pregabalin (LYRICA) 50 MG capsule, Take 50 mg by mouth 2 (two) times daily., Disp: , Rfl:    rosuvastatin (CRESTOR) 20 MG tablet, TAKE 1 TABLET BY MOUTH DAILY, Disp: 100 tablet, Rfl: 2    tamsulosin (FLOMAX) 0.4 MG CAPS capsule, Take 0.4 mg by mouth daily after supper., Disp: , Rfl:    Assessment  ICD-10-CM   1. Primary hypertension  I10 EKG 12-Lead    2. Hypercholesteremia  E78.00        Medications Discontinued During This Encounter  Medication Reason   docusate sodium (COLACE) 250 MG capsule    Flaxseed, Linseed, (FLAXSEED OIL) 1000 MG CAPS     No orders of the defined types were placed in this encounter.   Orders Placed This Encounter  Procedures   EKG 12-Lead   Recommendations:   Antonio Elliott is a 72 y.o.  Caucasian male with hypertension and hyperlipidemia and with history of sudden onset of dizzy spells and near syncope. Cardiac monitor revealed asymptomatic Mobitz 2 AV block which was brief and therefore shared decision was to continue with watchful waiting.  Patient has remained asymptomatic since 10/2020 without dizziness or syncope. Near syncope.  1. Primary hypertension Patient is presently doing well, presently on amlodipine with excellent control of his blood pressure.  Although ARB or ACE inhibitors would be more of cardiovascular benefits overall his risk factors are well-controlled and he is also lost weight and his BMI is close to 25.  Continue present medical management. - EKG 12-Lead  2. Hypercholesteremia Reviewed his lipids, being managed by his PCP, at least last year they were under excellent control, he has had recent lipid profile done, was told to be completely normal as well.  Otherwise he is presently doing well, normal EKG, normal physical exam and he has not had any dizziness or syncope or near syncope.  Hence I will see him back on a as needed basis.  Other orders - pregabalin (LYRICA) 50 MG capsule; Take 50 mg by mouth 2 (two) times daily. - tamsulosin (FLOMAX) 0.4 MG CAPS capsule; Take 0.4 mg by mouth daily after supper.    Antonio Decamp, MD, [facility] 02/26/2023, 9:18 AM Office: [phone] Fax: [phone] Pager:  [phone]

## 2023-07-04 ENCOUNTER — Other Ambulatory Visit: Payer: Self-pay | Admitting: Cardiology

## 2023-07-04 DIAGNOSIS — I10 Essential (primary) hypertension: Secondary | ICD-10-CM

## 2023-07-04 DIAGNOSIS — E7800 Pure hypercholesterolemia, unspecified: Secondary | ICD-10-CM

## 2023-12-11 ENCOUNTER — Other Ambulatory Visit: Payer: Self-pay | Admitting: Cardiology

## 2023-12-11 DIAGNOSIS — E7800 Pure hypercholesterolemia, unspecified: Secondary | ICD-10-CM

## 2023-12-11 DIAGNOSIS — I10 Essential (primary) hypertension: Secondary | ICD-10-CM

## 2024-02-09 ENCOUNTER — Other Ambulatory Visit: Payer: Self-pay | Admitting: Cardiology

## 2024-02-09 DIAGNOSIS — E7800 Pure hypercholesterolemia, unspecified: Secondary | ICD-10-CM

## 2024-02-09 DIAGNOSIS — I10 Essential (primary) hypertension: Secondary | ICD-10-CM

## 2024-04-15 LAB — COLOGUARD: COLOGUARD: NEGATIVE

## 2024-06-04 ENCOUNTER — Encounter (INDEPENDENT_AMBULATORY_CARE_PROVIDER_SITE_OTHER): Payer: Self-pay | Admitting: Ophthalmology

## 2024-06-04 ENCOUNTER — Ambulatory Visit (INDEPENDENT_AMBULATORY_CARE_PROVIDER_SITE_OTHER): Admitting: Ophthalmology

## 2024-06-04 DIAGNOSIS — H35033 Hypertensive retinopathy, bilateral: Secondary | ICD-10-CM | POA: Diagnosis not present

## 2024-06-04 DIAGNOSIS — I10 Essential (primary) hypertension: Secondary | ICD-10-CM

## 2024-06-04 DIAGNOSIS — Z961 Presence of intraocular lens: Secondary | ICD-10-CM

## 2024-06-04 DIAGNOSIS — H43813 Vitreous degeneration, bilateral: Secondary | ICD-10-CM | POA: Diagnosis not present

## 2024-06-04 NOTE — Progress Notes (Signed)
 [facility] - Clinic Note  06/04/2024   CHIEF COMPLAINT Patient presents for Retina Evaluation  HISTORY OF PRESENT ILLNESS: Antonio Elliott is a 73 y.o. male who presents to the clinic today for:  HPI     Retina Evaluation   In both eyes.  This started 25 years ago.  Duration of 25 years.  Associated Symptoms Floaters.  I, the attending physician,  performed the HPI with the patient and updated documentation appropriately.        Comments   Retina eval per Dr Octavia for floaters pt has been having a lot of floaters for the past 25 years pt has flashes sometimes       Last edited by Valdemar Rogue, MD on 06/12/2024  5:24 PM.     Patient states he sees a lot of floaters in his vision and has seen them for over 20+ years.  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  Referring physician: Octavia Charlie Hamilton, MD [address] [place],  [place] [address]  HISTORICAL INFORMATION:  Selected notes from the MEDICAL RECORD NUMBER Referred by Dr. Octavia [facility]:  Ocular Hx- PMH-   CURRENT MEDICATIONS: No current outpatient medications on file. (Ophthalmic Drugs)   No current facility-administered medications for this visit. (Ophthalmic Drugs)   Current Outpatient Medications (Other)  Medication Sig   amLODipine  (NORVASC ) 10 MG tablet TAKE 1 TABLET BY MOUTH IN THE  EVENING   Ascorbic Acid (VITAMIN C) 1000 MG tablet Take 2,000 mg by mouth daily.   Coenzyme Q10 (COQ-10 PO) Take 100 mg by mouth daily.   ibuprofen  (ADVIL ) 200 MG tablet Take 800 mg by mouth 3 (three) times daily as needed for mild pain.   pregabalin (LYRICA) 50 MG capsule Take 50 mg by mouth 2 (two) times daily.   rosuvastatin  (CRESTOR ) 20 MG tablet TAKE 1 TABLET BY MOUTH DAILY   tamsulosin (FLOMAX) 0.4 MG CAPS capsule Take 0.4 mg by mouth daily after supper.   No current facility-administered medications for this visit. (Other)   REVIEW OF SYSTEMS: ROS   Positive  for: Eyes Last edited by Resa Delon ORN, COT on 06/04/2024  8:37 AM.     ALLERGIES No Known Allergies PAST MEDICAL HISTORY Past Medical History:  Diagnosis Date   Back pain, chronic    Degenerative disk disease    Hyperlipidemia    Hypertension    Hypotension    Past Surgical History:  Procedure Laterality Date   ORIF FOOT FRACTURE     FAMILY HISTORY Family History  Problem Relation Age of Onset   Alzheimer's disease Mother    SOCIAL HISTORY Social History   Tobacco Use   Smoking status: Former    Current packs/day: 0.00    Types: Cigarettes    Quit date: 2020    Years since quitting: 5.7   Smokeless tobacco: Never  Vaping Use   Vaping status: Never Used  Substance Use Topics   Alcohol use: Yes    Alcohol/week: 1.0 standard drink of alcohol    Types: 1 Glasses of wine per week    Comment: occasionally    Drug use: No       OPHTHALMIC EXAM:  Base Eye Exam     Visual Acuity (Snellen - Linear)       Right Left   Dist cc 20/20 -2 20/25   Dist ph cc  20/20 -1  Tonometry (Tonopen, 8:43 AM)       Right Left   Pressure 12 14         Pupils       Dark Light Shape React APD   Right 3 2 Round Brisk None   Left 3 2 Round Brisk None         Visual Fields       Left Right    Full Full         Extraocular Movement       Right Left    Full, Ortho Full, Ortho         Dilation     Both eyes: 2.5% Phenylephrine @ 8:43 AM           Slit Lamp and Fundus Exam     External Exam       Right Left   External Normal Normal         Slit Lamp Exam       Right Left   Lids/Lashes Dermatochalasis - upper lid Dermatochalasis - upper lid   Conjunctiva/Sclera White and quiet White and quiet   Cornea Well healed cataract wound, Debris in tear film Well healed cataract wound, Arcus   Anterior Chamber Deep and clear Deep and clear   Iris Round and dilated Round and dilated   Lens PC IOL in good postition PC IOL in good  postion with open PC   Anterior Vitreous Vitreous syneresis, Posterior vitreous detachment, Vitreous condensations Vitreous syneresis, Posterior vitreous detachment, Vitreous condensations         Fundus Exam       Right Left   Disc Pink and sharp Pink and sharp   C/D Ratio 0.2 0.3   Macula Flat, Good foveal reflex, No heme or edema Flat, Good foveal reflex, No heme or edema   Vessels Vascular attenuation, Tortuous Vascular attenuation, Tortuous   Periphery Attached, No heme Attached, No heme           IMAGING AND PROCEDURES  Imaging and Procedures for 06/04/2024  OCT, Retina - OU - Both Eyes       Right Eye Quality was good. Central Foveal Thickness: 288. Progression has no prior data. Findings include normal foveal contour, no IRF, no SRF.   Left Eye Quality was good. Central Foveal Thickness: 285. Progression has no prior data. Findings include normal foveal contour, no IRF, no SRF (Trace vit opacities best seen on en face).   Notes *Images captured and stored on drive  Diagnosis / Impression:  OD: WNL OS: Trace vit opacities best seen on en face  Clinical management:  See below  Abbreviations: NFP - Normal foveal profile. CME - cystoid macular edema. PED - pigment epithelial detachment. IRF - intraretinal fluid. SRF - subretinal fluid. EZ - ellipsoid zone. ERM - epiretinal membrane. ORA - outer retinal atrophy. ORT - outer retinal tubulation. SRHM - subretinal hyper-reflective material. IRHM - intraretinal hyper-reflective material           ASSESSMENT/PLAN:   ICD-10-CM   1. PVD (posterior vitreous detachment), bilateral  H43.813 OCT, Retina - OU - Both Eyes    2. Hypertensive retinopathy of both eyes  H35.033 OCT, Retina - OU - Both Eyes    3. Essential hypertension  I10     4. Pseudophakia  Z96.1      PVD / vitreous syneresis, OU - pt reports visually significant vitreous opacities  - Discussed findings and prognosis - No RT or RD  on exam -  Reviewed s/s of RT/RD  - Strict return precautions for any such RT/RD signs/symptoms - reviewed risk/ benefits of PPV for vitreous opacities. Advised the patient to research the surgery and risk  - f/u in 6-8 wks -- DFE/OCT   2,3. Hypertensive retinopathy OU - discussed importance of tight BP control - monitor   4. Pseudophakia OU  - s/p CE/IOL OU w/ Dr. CANDIE Gaudy  - s/p YAG OS w/ Dr. CANDIE Gaudy  - IOL in good position, doing well  - monitor   Ophthalmic Meds Ordered this visit:  No orders of the defined types were placed in this encounter.    Return in about 8 weeks (around 07/30/2024) for f/u PVD OU, DFE, OCT.  There are no Patient Instructions on file for this visit.  Explained the diagnoses, plan, and follow up with the patient and they expressed understanding.  Patient expressed understanding of the importance of proper follow up care.   This document serves as a record of services personally performed by Redell JUDITHANN Hans, MD, PhD. It was created on their behalf by Wanda GEANNIE Keens, COT an ophthalmic technician. The creation of this record is the provider's dictation and/or activities during the visit.    Electronically signed by:  Wanda GEANNIE Keens, COT  06/12/24 5:27 PM  Redell JUDITHANN Hans, M.D., Ph.D. Diseases & Surgery of the Retina and Vitreous [facility] 06/04/2024  I have reviewed the above documentation for accuracy and completeness, and I agree with the above. Redell JUDITHANN Hans, M.D., Ph.D. 06/12/24 5:29 PM   Abbreviations: M myopia (nearsighted); A astigmatism; H hyperopia (farsighted); P presbyopia; Mrx spectacle prescription;  CTL contact lenses; OD right eye; OS left eye; OU both eyes  XT exotropia; ET esotropia; PEK punctate epithelial keratitis; PEE punctate epithelial erosions; DES dry eye syndrome; MGD meibomian gland dysfunction; ATs artificial tears; PFAT's preservative free artificial tears; NSC nuclear sclerotic cataract; PSC posterior  subcapsular cataract; ERM epi-retinal membrane; PVD posterior vitreous detachment; RD retinal detachment; DM diabetes mellitus; DR diabetic retinopathy; NPDR non-proliferative diabetic retinopathy; PDR proliferative diabetic retinopathy; CSME clinically significant macular edema; DME diabetic macular edema; dbh dot blot hemorrhages; CWS cotton wool spot; POAG primary open angle glaucoma; C/D cup-to-disc ratio; HVF humphrey visual field; GVF goldmann visual field; OCT optical coherence tomography; IOP intraocular pressure; BRVO Branch retinal vein occlusion; CRVO central retinal vein occlusion; CRAO central retinal artery occlusion; BRAO branch retinal artery occlusion; RT retinal tear; SB scleral buckle; PPV pars plana vitrectomy; VH Vitreous hemorrhage; PRP panretinal laser photocoagulation; IVK intravitreal kenalog; VMT vitreomacular traction; MH Macular hole;  NVD neovascularization of the disc; NVE neovascularization elsewhere; AREDS age related eye disease study; ARMD age related macular degeneration; POAG primary open angle glaucoma; EBMD epithelial/anterior basement membrane dystrophy; ACIOL anterior chamber intraocular lens; IOL intraocular lens; PCIOL posterior chamber intraocular lens; Phaco/IOL phacoemulsification with intraocular lens placement; PRK photorefractive keratectomy; LASIK laser assisted in situ keratomileusis; HTN hypertension; DM diabetes mellitus; COPD chronic obstructive pulmonary disease

## 2024-06-12 ENCOUNTER — Encounter (INDEPENDENT_AMBULATORY_CARE_PROVIDER_SITE_OTHER): Payer: Self-pay | Admitting: Ophthalmology

## 2024-07-30 ENCOUNTER — Encounter (INDEPENDENT_AMBULATORY_CARE_PROVIDER_SITE_OTHER): Admitting: Ophthalmology
